# Patient Record
Sex: Female | Born: 1975
Health system: Southern US, Community
[De-identification: ages and names within clinical notes are randomized; demographics above are authoritative.]

## PROBLEM LIST (undated history)

## (undated) DIAGNOSIS — C801 Malignant (primary) neoplasm, unspecified: Secondary | ICD-10-CM

## (undated) DIAGNOSIS — N63 Unspecified lump in unspecified breast: Secondary | ICD-10-CM

## (undated) DIAGNOSIS — S060X9A Concussion with loss of consciousness of unspecified duration, initial encounter: Secondary | ICD-10-CM

## (undated) DIAGNOSIS — S060XAA Concussion with loss of consciousness status unknown, initial encounter: Secondary | ICD-10-CM

## (undated) DIAGNOSIS — J45909 Unspecified asthma, uncomplicated: Secondary | ICD-10-CM

## (undated) DIAGNOSIS — T7840XA Allergy, unspecified, initial encounter: Secondary | ICD-10-CM

## (undated) DIAGNOSIS — N3010 Interstitial cystitis (chronic) without hematuria: Secondary | ICD-10-CM

## (undated) DIAGNOSIS — D649 Anemia, unspecified: Secondary | ICD-10-CM

## (undated) DIAGNOSIS — E039 Hypothyroidism, unspecified: Secondary | ICD-10-CM

## (undated) HISTORY — DX: Allergy, unspecified, initial encounter: T78.40XA

## (undated) HISTORY — PX: APPENDECTOMY: SHX54

## (undated) HISTORY — DX: Concussion with loss of consciousness status unknown, initial encounter: S06.0XAA

## (undated) HISTORY — DX: Unspecified asthma, uncomplicated: J45.909

## (undated) HISTORY — PX: ABDOMINOPLASTY: SHX5355

## (undated) HISTORY — PX: ABDOMINAL HYSTERECTOMY: SHX81

## (undated) HISTORY — PX: CHOLECYSTECTOMY: SHX55

## (undated) HISTORY — DX: Concussion with loss of consciousness of unspecified duration, initial encounter: S06.0X9A

## (undated) HISTORY — DX: Anemia, unspecified: D64.9

## (undated) HISTORY — PX: TUBAL LIGATION: SHX77

---
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
## 2012-03-29 ENCOUNTER — Encounter (HOSPITAL_COMMUNITY): Payer: Self-pay | Admitting: Emergency Medicine

## 2012-03-29 ENCOUNTER — Emergency Department (HOSPITAL_COMMUNITY)
Admission: EM | Admit: 2012-03-29 | Discharge: 2012-03-29 | Disposition: A | Discharge status: Home / Self Care | Payer: Worker's Compensation | Attending: Emergency Medicine | Admitting: Emergency Medicine

## 2012-03-29 DIAGNOSIS — Z7721 Contact with and (suspected) exposure to potentially hazardous body fluids: Secondary | ICD-10-CM | POA: Insufficient documentation

## 2012-03-29 DIAGNOSIS — N3010 Interstitial cystitis (chronic) without hematuria: Secondary | ICD-10-CM | POA: Insufficient documentation

## 2012-03-29 DIAGNOSIS — Z578 Occupational exposure to other risk factors: Secondary | ICD-10-CM

## 2012-03-29 DIAGNOSIS — Z79899 Other long term (current) drug therapy: Secondary | ICD-10-CM | POA: Insufficient documentation

## 2012-03-29 DIAGNOSIS — E039 Hypothyroidism, unspecified: Secondary | ICD-10-CM | POA: Insufficient documentation

## 2012-03-29 DIAGNOSIS — Z8742 Personal history of other diseases of the female genital tract: Secondary | ICD-10-CM | POA: Insufficient documentation

## 2012-03-29 HISTORY — DX: Interstitial cystitis (chronic) without hematuria: N30.10

## 2012-03-29 HISTORY — DX: Hypothyroidism, unspecified: E03.9

## 2012-03-29 LAB — HEPATITIS C ANTIBODY (REFLEX): HCV Ab: NEGATIVE

## 2012-03-29 LAB — HEPATITIS B SURFACE ANTIGEN: Hepatitis B Surface Ag: NEGATIVE

## 2012-03-29 LAB — HIV RAPID SCREEN (BLD OR BODY FLD EXPOSURE): Rapid HIV Screen: NONREACTIVE

## 2012-03-29 NOTE — ED Notes (Signed)
Patient works at [facility] [employer] and was exposed to blood with a patient during a needle stick.  Patient sent here for blood work.

## 2012-03-29 NOTE — ED Notes (Signed)
Patient left before signing the signature pad for d/c home

## 2012-03-29 NOTE — ED Notes (Signed)
Patient reports that she was accidentally stuck with needle on right pointer finger while starting intravenous line on patient. Patient has no known history of blood diseases.

## 2012-03-29 NOTE — ED Provider Notes (Signed)
Medical screening examination/treatment/procedure(s) were performed by non-physician practitioner and as supervising physician I was immediately available for consultation/collaboration.   Laray Anger, DO 03/29/12 1913

## 2012-03-29 NOTE — ED Provider Notes (Signed)
History     CSN: [id number]  Arrival date & time 03/29/12  0009   First MD Initiated Contact with Patient 03/29/12 0036      Chief Complaint  Patient presents with  . Body Fluid Exposure    HPI  History provided by the patient. Patient is a 37 year old female who presents after body fluid exposure. 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 Patient states that she reviewed her patient's medical records and did not find any concerning evidence of a possible transmissible disease. Patient was married in a monogamous relationship hent otherwise low risk. Patient immediately washed her finger and rubbed alcohol wipes many times over the area. She was instructed to come to emergency room for further evaluation and treatment. She has been vaccinated against hepatitis B and is current on her tetanus immunization. She currently denies any pain or discomfort. No other aggravating or alleviating factors. No other associated symptoms    Past Medical History  Diagnosis Date  . Hypothyroid   . Interstitial cystitis     Past Surgical History  Procedure Date  . Tubal ligation     History reviewed. No pertinent family history.  History  Substance Use Topics  . Smoking status: Never Smoker   . Smokeless tobacco: Not on file  . Alcohol Use: No    OB History    Grav Para Term Preterm Abortions TAB SAB Ect Mult Living                  Review of Systems  All other systems reviewed and are negative.    Allergies  Shellfish allergy  Home Medications   Current Outpatient Rx  Name  Route  Sig  Dispense  Refill  . ACETAMINOPHEN 500 MG PO TABS   Oral   Take 500 mg by mouth every 6 (six) hours as needed. For pain         . ALBUTEROL SULFATE HFA 108 (90 BASE) MCG/ACT IN AERS   Inhalation   Inhale 2  puffs into the lungs every 6 (six) hours as needed. For shortness of breath         . AMOXICILLIN 500 MG PO CAPS   Oral   Take 500 mg by mouth 2 (two) times daily.         Marland Kitchen VITAMIN D 1000 UNITS PO TABS   Oral   Take 2,000 Units by mouth daily.         Marland Kitchen FLAX SEEDS PO   Oral   Take 2 tablets by mouth daily.         Marland Kitchen FLUTICASONE PROPIONATE 50 MCG/ACT NA SUSP   Nasal   Place 2 sprays into the nose daily.         . GUAIFENESIN ER 600 MG PO TB12   Oral   Take 1,200 mg by mouth daily as needed. For congestion         . MILK THISTLE 175 MG PO TABS   Oral   Take 175 mg by mouth daily.         Marland Kitchen PENTOSAN POLYSULFATE SODIUM 100 MG PO CAPS   Oral   Take 100 mg by mouth daily.         . THYROID 90 MG PO TABS   Oral  Take 180 mg by mouth daily.           BP 126/76  Pulse 84  Temp 97.5 F (36.4 C) (Oral)  Resp 16  SpO2 100%  Physical Exam  Nursing note and vitals reviewed. Constitutional: She is oriented to person, place, and time. She appears well-developed and well-nourished. No distress.  HENT:  Head: Normocephalic.  Eyes: Conjunctivae normal are normal.  Cardiovascular: Normal rate and regular rhythm.   Pulmonary/Chest: Effort normal and breath sounds normal.  Abdominal: Soft.  Musculoskeletal: Normal range of motion.       Evidence of a small puncture wound to the right index finger consistent with history of needle stick. No active bleeding. No swelling. Normal range of motion.  Neurological: She is alert and oriented to person, place, and time.  Skin: Skin is warm and dry. No rash noted.  Psychiatric: She has a normal mood and affect. Her behavior is normal.    ED Course  Procedures    Labs Reviewed  HIV RAPID SCREEN (BLD OR BODY FLD EXPOSURE)  HEPATITIS B SURFACE ANTIGEN  HEPATITIS C ANTIBODY (REFLEX)    1. Employee exposure to body fluids       MDM  Patient seen and evaluated. Patient in no acute distress. Exposure panel sent.  Patient instructed to followup with occupational health department and PCP.        Angus Seller, [place] 03/29/12 [phone]

## 2016-07-10 ENCOUNTER — Encounter (HOSPITAL_COMMUNITY): Payer: Self-pay | Admitting: Emergency Medicine

## 2016-07-10 DIAGNOSIS — Y939 Activity, unspecified: Secondary | ICD-10-CM | POA: Insufficient documentation

## 2016-07-10 DIAGNOSIS — Y999 Unspecified external cause status: Secondary | ICD-10-CM | POA: Insufficient documentation

## 2016-07-10 DIAGNOSIS — S3992XA Unspecified injury of lower back, initial encounter: Secondary | ICD-10-CM | POA: Diagnosis present

## 2016-07-10 DIAGNOSIS — Z91040 Latex allergy status: Secondary | ICD-10-CM | POA: Diagnosis not present

## 2016-07-10 DIAGNOSIS — Y92410 Unspecified street and highway as the place of occurrence of the external cause: Secondary | ICD-10-CM | POA: Diagnosis not present

## 2016-07-10 DIAGNOSIS — M542 Cervicalgia: Secondary | ICD-10-CM | POA: Insufficient documentation

## 2016-07-10 DIAGNOSIS — S39012A Strain of muscle, fascia and tendon of lower back, initial encounter: Secondary | ICD-10-CM | POA: Diagnosis not present

## 2016-07-10 DIAGNOSIS — R930 Abnormal findings on diagnostic imaging of skull and head, not elsewhere classified: Secondary | ICD-10-CM | POA: Diagnosis not present
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
## 2016-07-11 ENCOUNTER — Emergency Department (HOSPITAL_COMMUNITY): Payer: Managed Care, Other (non HMO)

## 2016-07-11 ENCOUNTER — Emergency Department (HOSPITAL_COMMUNITY)
Admission: EM | Admit: 2016-07-11 | Discharge: 2016-07-11 | Disposition: A | Discharge status: Home / Self Care | Payer: Managed Care, Other (non HMO) | Attending: Emergency Medicine | Admitting: Emergency Medicine

## 2016-07-11 DIAGNOSIS — S39012A Strain of muscle, fascia and tendon of lower back, initial encounter: Secondary | ICD-10-CM | POA: Diagnosis not present

## 2016-07-11 MED ORDER — IBUPROFEN 800 MG PO TABS
800.0000 mg | ORAL_TABLET | Freq: Once | ORAL | Status: AC
  Administered 2016-07-11: 800 mg via ORAL
  Filled 2016-07-11: qty 1

## 2016-07-11 MED ORDER — CYCLOBENZAPRINE HCL 10 MG PO TABS: 10.0000 mg | ORAL_TABLET | Freq: Two times a day (BID) | ORAL | 0 refills | Status: DC | PRN

## 2016-07-11 MED ORDER — IBUPROFEN 800 MG PO TABS: 800.0000 mg | ORAL_TABLET | Freq: Three times a day (TID) | ORAL | 0 refills | Status: DC

## 2016-07-11 NOTE — ED Notes (Signed)
C-collar removed by Shirlean Mylar, EMT per Dr.Rancour

## 2016-07-11 NOTE — Discharge Instructions (Signed)
Your xrays are reassuring. Follow up with your doctor. Return to the ED if you develop new or worsening symptoms.

## 2016-07-11 NOTE — ED Notes (Signed)
Ambulated pt from room D32 in front of nursing desk to Trauma B and back to room. Pt denies any sob or dizziness. Dr Wyvonnia Dusky assessed pt upon arrival to room.
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
## 2016-07-11 NOTE — ED Notes (Signed)
Patient transported to X-ray 
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
## 2017-05-07 ENCOUNTER — Other Ambulatory Visit: Payer: Self-pay | Admitting: Radiology

## 2017-05-07 DIAGNOSIS — N6310 Unspecified lump in the right breast, unspecified quadrant: Secondary | ICD-10-CM

## 2017-05-16 ENCOUNTER — Ambulatory Visit
Admission: RE | Admit: 2017-05-16 | Discharge: 2017-05-16 | Disposition: A | Discharge status: Home / Self Care | Payer: BLUE CROSS/BLUE SHIELD | Source: Ambulatory Visit | Attending: Radiology | Admitting: Radiology

## 2017-05-16 DIAGNOSIS — N6310 Unspecified lump in the right breast, unspecified quadrant: Secondary | ICD-10-CM

## 2017-05-16 HISTORY — DX: Malignant (primary) neoplasm, unspecified: C80.1

## 2017-05-16 HISTORY — DX: Unspecified lump in unspecified breast: N63.0

## 2017-06-11 ENCOUNTER — Ambulatory Visit: Payer: Self-pay | Admitting: Obstetrics & Gynecology

## 2018-10-29 ENCOUNTER — Ambulatory Visit (INDEPENDENT_AMBULATORY_CARE_PROVIDER_SITE_OTHER): Payer: BC Managed Care – PPO | Admitting: Neurology

## 2018-10-29 ENCOUNTER — Other Ambulatory Visit: Payer: Self-pay

## 2018-10-29 ENCOUNTER — Encounter: Payer: Self-pay | Admitting: Neurology

## 2018-10-29 ENCOUNTER — Encounter: Payer: Self-pay | Admitting: *Deleted

## 2018-10-29 VITALS — BP 120/74 | HR 80 | Temp 97.8°F | Ht 64.0 in | Wt 163.0 lb

## 2018-10-29 DIAGNOSIS — R29898 Other symptoms and signs involving the musculoskeletal system: Secondary | ICD-10-CM | POA: Diagnosis not present

## 2018-10-29 DIAGNOSIS — M5416 Radiculopathy, lumbar region: Secondary | ICD-10-CM | POA: Diagnosis not present

## 2018-10-29 DIAGNOSIS — R32 Unspecified urinary incontinence: Secondary | ICD-10-CM | POA: Diagnosis not present

## 2018-10-29 DIAGNOSIS — R202 Paresthesia of skin: Secondary | ICD-10-CM

## 2018-10-29 DIAGNOSIS — R200 Anesthesia of skin: Secondary | ICD-10-CM
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
## 2018-10-29 NOTE — Patient Instructions (Signed)
Physical Therapy MRI lumbar Spine Emg/ncs   Electromyoneurogram Electromyoneurogram is a test to check how well your muscles and nerves are working. This procedure includes the combined use of electromyogram (EMG) and nerve conduction study (NCS). EMG is used to look for muscular disorders. NCS, which is also called electroneurogram, measures how well your nerves are controlling your muscles. The procedures are usually done together to check if your muscles and nerves are healthy. If the results of the tests are abnormal, this may indicate disease or injury, such as a neuromuscular disease or peripheral nerve damage. Tell a health care provider about:  Any allergies you have.  All medicines you are taking, including vitamins, herbs, eye drops, creams, and over-the-counter medicines.  Any problems you or family members have had with anesthetic medicines.  Any blood disorders you have.  Any surgeries you have had.  Any medical conditions you have.  If you have a pacemaker.  Whether you are pregnant or may be pregnant. What are the risks? Generally, this is a safe procedure. However, problems may occur, including:  Infection where the electrodes were inserted.  Bleeding. What happens before the procedure? Medicines Ask your health care provider about:  Changing or stopping your regular medicines. This is especially important if you are taking diabetes medicines or blood thinners.  Taking medicines such as aspirin and ibuprofen. These medicines can thin your blood. Do not take these medicines unless your health care provider tells you to take them.  Taking over-the-counter medicines, vitamins, herbs, and supplements. General instructions  Your health care provider may ask you to avoid: ? Beverages that have caffeine, such as coffee and tea. ? Any products that contain nicotine or tobacco. These products include cigarettes, e-cigarettes, and chewing tobacco. If you need help  quitting, ask your health care provider.  Do not use lotions or creams on the same day that you will be having the procedure. What happens during the procedure? For EMG   Your health care provider will ask you to stay in a position so that he or she can access the muscle that will be studied. You may be standing, sitting, or lying down.  You may be given a medicine that numbs the area (local anesthetic).  A very thin needle that has an electrode will be inserted into your muscle.  Another small electrode will be placed on your skin near the muscle.  Your health care provider will ask you to continue to remain still.  The electrodes will send a signal that tells about the electrical activity of your muscles. You may see this on a monitor or hear it in the room.  After your muscles have been studied at rest, your health care provider will ask you to contract or flex your muscles. The electrodes will send a signal that tells about the electrical activity of your muscles.  Your health care provider will remove the electrodes and the electrode needles when the procedure is finished. The procedure may vary among health care providers and hospitals. For NCS   An electrode that records your nerve activity (recording electrode) will be placed on your skin by the muscle that is being studied.  An electrode that is used as a reference (reference electrode) will be placed near the recording electrode.  A paste or gel will be applied to your skin between the recording electrode and the reference electrode.  Your nerve will be stimulated with a mild shock. Your health care provider will measure how much  time it takes for your muscle to react.  Your health care provider will remove the electrodes and the gel when the procedure is finished. The procedure may vary among health care providers and hospitals. What happens after the procedure?  It is up to you to get the results of your procedure.  Ask your health care provider, or the department that is doing the procedure, when your results will be ready.  Your health care provider may: ? Give you medicines for any pain. ? Monitor the insertion sites to make sure that bleeding stops. Summary  Electromyoneurogram is a test to check how well your muscles and nerves are working.  If the results of the tests are abnormal, this may indicate disease or injury.  This is a safe procedure. However, problems may occur, such as bleeding and infection.  Your health care provider will do two tests to complete this procedure. One checks your muscles (EMG) and another checks your nerves (NCS).  It is up to you to get the results of your procedure. Ask your health care provider, or the department that is doing the procedure, when your results will be ready. This information is not intended to replace advice given to you by your health care provider. Make sure you discuss any questions you have with your health care provider. Document Released: 06/14/2004 Document Revised: 10/28/2017 Document Reviewed: 10/10/2017 Elsevier Patient Education  2020 [employer].

## 2018-11-04 ENCOUNTER — Telehealth: Payer: Self-pay | Admitting: Neurology

## 2018-11-04 LAB — COMPREHENSIVE METABOLIC PANEL
ALT: 65 IU/L — ABNORMAL HIGH (ref 0–32)
AST: 37 IU/L (ref 0–40)
Albumin/Globulin Ratio: 2.5 — ABNORMAL HIGH (ref 1.2–2.2)
Albumin: 4.5 g/dL (ref 3.8–4.8)
Alkaline Phosphatase: 53 IU/L (ref 39–117)
BUN/Creatinine Ratio: 13 (ref 9–23)
BUN: 9 mg/dL (ref 6–24)
Bilirubin Total: 0.9 mg/dL (ref 0.0–1.2)
CO2: 23 mmol/L (ref 20–29)
Calcium: 9.1 mg/dL (ref 8.7–10.2)
Chloride: 104 mmol/L (ref 96–106)
Creatinine, Ser: 0.67 mg/dL (ref 0.57–1.00)
GFR calc Af Amer: 125 mL/min/{1.73_m2} (ref >59)
GFR calc non Af Amer: 109 mL/min/{1.73_m2} (ref >59)
Globulin, Total: 1.8 g/dL (ref 1.5–4.5)
Glucose: 96 mg/dL (ref 65–99)
Potassium: 4.3 mmol/L (ref 3.5–5.2)
Sodium: 140 mmol/L (ref 134–144)
Total Protein: 6.3 g/dL (ref 6.0–8.5)

## 2018-11-04 LAB — METHYLMALONIC ACID, SERUM: Methylmalonic Acid: 118 nmol/L (ref 0–378)

## 2018-11-04 LAB — CBC
Hematocrit: 43 % (ref 34.0–46.6)
Hemoglobin: 14.9 g/dL (ref 11.1–15.9)
MCH: 30.5 pg (ref 26.6–33.0)
MCHC: 34.7 g/dL (ref 31.5–35.7)
MCV: 88 fL (ref 79–97)
Platelets: 237 10*3/uL (ref 150–450)
RBC: 4.89 x10E6/uL (ref 3.77–5.28)
RDW: 12.2 % (ref 11.7–15.4)
WBC: 4 10*3/uL (ref 3.4–10.8)

## 2018-11-04 LAB — B12 AND FOLATE PANEL
Folate: 4.1 ng/mL (ref >3.0)
Vitamin B-12: 978 pg/mL (ref 232–1245)

## 2018-11-04 NOTE — Telephone Encounter (Signed)
unable to leave vmail the mail box was full  BCBS Auth: [id number] (exp. 11/04/18 to 05/02/19)

## 2018-11-05 ENCOUNTER — Telehealth: Payer: Self-pay | Admitting: *Deleted

## 2018-11-05 NOTE — Telephone Encounter (Signed)
I spoke with the patient and discussed her lab results. She verbalized understanding of unremarkable labs however ALT was elevated slightly. Recommended f/u with PCP in the next few months. I recommended she setup a mychart as the pt asked if the results could be posted. Confirmed cell phone and sent mychart info to her phone. I asked her to let us know when the account is setup so we can release the labs. The pt verbalized understanding and appreciation for the call.

## 2018-11-05 NOTE — Telephone Encounter (Signed)
-----   Message from Melvenia Beam, MD sent at 11/02/2018  8:36 PM EDT ----- Labs unremarkable, slight increase in ALT may be due to many things such as fatty liver, medications (too much tylenol for example), infections, alcohol or other. However it is minimally elevated,not concerning at this point, I recommend following up in primary care in a few months not urgent.

## 2018-11-10 ENCOUNTER — Ambulatory Visit: Payer: BC Managed Care – PPO | Attending: Neurology

## 2018-11-10 ENCOUNTER — Other Ambulatory Visit: Payer: Self-pay

## 2018-11-10 DIAGNOSIS — M62830 Muscle spasm of back: Secondary | ICD-10-CM | POA: Diagnosis present

## 2018-11-10 DIAGNOSIS — G8929 Other chronic pain: Secondary | ICD-10-CM

## 2018-11-10 DIAGNOSIS — M5442 Lumbago with sciatica, left side: Secondary | ICD-10-CM | POA: Diagnosis present

## 2018-11-10 DIAGNOSIS — M5441 Lumbago with sciatica, right side: Secondary | ICD-10-CM | POA: Diagnosis present

## 2018-11-10 DIAGNOSIS — R293 Abnormal posture: Secondary | ICD-10-CM | POA: Diagnosis present

## 2018-11-10 NOTE — Patient Instructions (Signed)
LTR , knee to chest , piriformis 2-3 reps 30 sec x 2 per dau , PPT 10 reps  5-10 sec

## 2018-11-10 NOTE — Telephone Encounter (Signed)
x2 unable to leave vmail the mail box is full.
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
## 2018-11-20 ENCOUNTER — Encounter

## 2018-11-25 ENCOUNTER — Ambulatory Visit: Payer: BC Managed Care – PPO | Admitting: Physical Therapy

## 2018-12-03 ENCOUNTER — Ambulatory Visit (INDEPENDENT_AMBULATORY_CARE_PROVIDER_SITE_OTHER): Payer: BC Managed Care – PPO | Admitting: Neurology

## 2018-12-03 ENCOUNTER — Ambulatory Visit: Payer: BC Managed Care – PPO | Admitting: Neurology

## 2018-12-03 ENCOUNTER — Other Ambulatory Visit: Payer: Self-pay

## 2018-12-03 DIAGNOSIS — Z0289 Encounter for other administrative examinations: Secondary | ICD-10-CM

## 2018-12-03 DIAGNOSIS — R202 Paresthesia of skin: Secondary | ICD-10-CM | POA: Diagnosis not present

## 2018-12-03 NOTE — Progress Notes (Signed)
Medial lower leg sensory changes. See procedure note

## 2018-12-03 NOTE — Telephone Encounter (Signed)
I spoke to the patient she is scheduled at [facility] for 12/08/18 no to the covid questions.

## 2018-12-04 ENCOUNTER — Ambulatory Visit: Payer: BLUE CROSS/BLUE SHIELD | Admitting: Physical Therapy
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
## 2018-12-08 ENCOUNTER — Ambulatory Visit: Payer: BC Managed Care – PPO

## 2018-12-08 ENCOUNTER — Other Ambulatory Visit: Payer: Self-pay

## 2018-12-08 DIAGNOSIS — M5416 Radiculopathy, lumbar region: Secondary | ICD-10-CM

## 2018-12-08 DIAGNOSIS — R32 Unspecified urinary incontinence: Secondary | ICD-10-CM | POA: Diagnosis not present

## 2018-12-08 DIAGNOSIS — R202 Paresthesia of skin: Secondary | ICD-10-CM

## 2018-12-08 DIAGNOSIS — R29898 Other symptoms and signs involving the musculoskeletal system: Secondary | ICD-10-CM

## 2018-12-08 DIAGNOSIS — R200 Anesthesia of skin: Secondary | ICD-10-CM

## 2018-12-13 ENCOUNTER — Other Ambulatory Visit: Payer: Self-pay | Admitting: Neurology

## 2018-12-13 MED ORDER — GABAPENTIN 300 MG PO CAPS: 300.0000 mg | ORAL_CAPSULE | Freq: Three times a day (TID) | ORAL | 4 refills | Status: DC

## 2018-12-14 ENCOUNTER — Other Ambulatory Visit: Payer: Self-pay | Admitting: Neurology

## 2018-12-14 DIAGNOSIS — G5780 Other specified mononeuropathies of unspecified lower limb: Secondary | ICD-10-CM

## 2018-12-14 DIAGNOSIS — S8990XD Unspecified injury of unspecified lower leg, subsequent encounter: Secondary | ICD-10-CM

## 2018-12-16 ENCOUNTER — Ambulatory Visit (INDEPENDENT_AMBULATORY_CARE_PROVIDER_SITE_OTHER): Payer: Self-pay | Admitting: Rehabilitative and Restorative Service Providers"

## 2018-12-16 ENCOUNTER — Encounter: Payer: Self-pay | Admitting: Rehabilitative and Restorative Service Providers"

## 2018-12-16 ENCOUNTER — Other Ambulatory Visit: Payer: Self-pay

## 2018-12-16 DIAGNOSIS — M5442 Lumbago with sciatica, left side: Secondary | ICD-10-CM | POA: Diagnosis not present

## 2018-12-16 DIAGNOSIS — M5441 Lumbago with sciatica, right side: Secondary | ICD-10-CM

## 2018-12-16 DIAGNOSIS — G8929 Other chronic pain: Secondary | ICD-10-CM

## 2018-12-16 NOTE — Patient Instructions (Signed)
Access Code: [place]  URL: [email or web address]  Date: 12/16/2018  Prepared by: Rudell Cobb   Exercises Hip Flexor Stretch at [facility] of Bed - 2-3 reps - 1 sets - 30 seconds hold - 2x daily - 7x weekly Sidelying ITB Stretch off Table - 2-3 reps - 1 sets - 30 seconds hold - 2x daily - 7x weekly Supine Hamstring Stretch with Strap - 2-3 reps - 1 sets - 30 seconds hold - 2x daily - 7x weekly Standing Ankle Dorsiflexor Toe Extensor Stretch - 10 reps - 3 sets                            - 1x daily - 7x weekly

## 2018-12-16 NOTE — Therapy (Signed)
[place], [place], [address] Phone: [phone]   Fax:  [phone]  Physical Therapy Treatment  Patient Details  Name: Jamie Cardenas MRN: [id number] Date of Birth: [birth year] Referring Provider (PT): Sarina Ill, MD   Encounter Date: 12/16/2018  PT End of Session - 12/16/18 0813    Visit Number  2    Number of Visits  12    Date for PT Re-Evaluation  12/25/18    Authorization Type  BCBS    PT Start Time  0725    PT Stop Time  0810    PT Time Calculation (min)  45 min       Past Medical History:  Diagnosis Date  . Breast mass    Right breast lumps   . Cancer ([place]) 1997   cervical   . Concussion    x2  . Hypothyroid   . Interstitial cystitis     Past Surgical History:  Procedure Laterality Date  . ABDOMINAL HYSTERECTOMY    . APPENDECTOMY    . CHOLECYSTECTOMY    . TUBAL LIGATION      There were no vitals filed for this visit.  Subjective Assessment - 12/16/18 0727    Subjective  The patient reports swelling every day in her distal legs (left leg greater than right).  She has pain in bilateral LEs with standing and walking and describes an achiness.  She also cannot sit and ride for a long time and feels radiating leg pain.    Patient Stated Goals  She wants to be able to work out again    Currently in Pain?  Yes    Pain Location  Hip    Pain Orientation  Right;Left    Pain Descriptors / Indicators  Aching;Sore    Pain Onset  More than a month ago    Pain Frequency  Constant    Aggravating Factors   sitting for long periods; walking, standing.    Pain Relieving Factors  stretching         [facility] PT Assessment - 12/16/18 0731      AROM   AROM Assessment Site  Knee;Ankle    Right/Left Knee  --   WFLs   Right/Left Ankle  Left   c/o tightness L anterior tib t/o the day     Strength   Overall Strength Comments  3+/5 L hip abduction 4/5 L hip extension 5/5 R hip  extension                   [facility] Adult PT Treatment/Exercise - 12/16/18 0816      Exercises   Exercises  Knee/Hip;Ankle      Knee/Hip Exercises: Stretches   Active Hamstring Stretch  Right;Left;2 reps;30 seconds    Active Hamstring Stretch Limitations  supine with strap, noted tightness L leg in comparisong to right leg    Quad Stretch  Left;Right;2 reps;20 seconds   prone   Hip Flexor Stretch  Right;Left;1 rep;30 seconds    Hip Flexor Stretch Limitations  Patient c/o L SI joint pain *performed L SI mobs after stretch to increase mobility.  Recommended patient attempt at home and hold if continues to provoke L SI pain    ITB Stretch  Right;Left;30 seconds;1 rep    ITB Stretch Limitations  Taught for HEP      Manual Therapy   Manual Therapy  Joint mobilization;Neural Stretch;Manual Traction;Soft tissue mobilization    Manual therapy comments  PT provided joint mobilization, hip traction for pain reduction and to increase mobility    Joint Mobilization  PRONE:  L hip ER and abducted (knee on stool in flexion) with grade II-III joint mobility at posterior femur provided for hip tightness.  L SI posterior>anterior joint mobilization grade II with some discomfort when PT has patinet IR L hip during mobilizations.  SUPINE:  anterior > posterior hip joint mobility grade III    Soft tissue mobilization  SIDELYING:  IT band STM    Manual Traction  Hip distraction using belt x 5 minutes each leg for general joint mobility.  L hip has some discomfort noted.      Ankle Exercises: Stretches   Other Stretch  Standing anterior tibialis stretch x 30 seconds x 3 reps.  *Patient notes discomfort-- instructed to go to point of pull, not pain.             PT Education - 12/16/18 0812    Education Details  HEP per [employer]) Educated  Patient    Methods  Explanation;Demonstration;Handout    Comprehension  Verbalized understanding;Returned demonstration       PT Short  Term Goals - 12/16/18 0814      PT SHORT TERM GOAL #1   Title  She will be indpendent with iniitial HEp    Baseline  PT established initial HEP for stretching hip flexors, anterior tibialis, hamstrings, and IT band.    Time  2    Period  Weeks    Status  Achieved    Target Date  11/24/18      PT SHORT TERM GOAL #2   Title  She will report pain decr 20% or more in lower back    Time  6    Period  Weeks    Status  On-going    Target Date  11/24/18        PT Long Term Goals - 12/16/18 0815      PT LONG TERM GOAL #1   Title  she will be indpendent with all HEP issued    Time  6    Period  Weeks    Status  New    Target Date  12/22/18      PT LONG TERM GOAL #2   Title  she will report pain decreased 50% and become intermittant in back and legs    Time  6    Period  Weeks    Status  New      PT LONG TERM GOAL #3   Title  She will report sleep improved 50% or more due to decr pain    Time  6    Period  Weeks    Status  New      PT LONG TERM GOAL #4   Title  She reports  able to sit for 45 min without increased pain.    Time  6    Period  Weeks    Status  New            Plan - 12/16/18 0815    Clinical Impression Statement  The patient transferred care from our [facility] ortho clinic @ [address] to our [facility].  She works in [employer] and this location is more convenient.  At today's visit, she presents with tightness L hip joint, L SI joint pain, bilateral medial LE numbness, tightness/ pain with palpation bilateral IT bands, tightness L anterior tibialis, weakness L hip.  PT Frequency  2x / week    PT Duration  6 weeks    PT Treatment/Interventions  Dry needling;Passive range of motion;Manual techniques;Patient/family education;Therapeutic exercise;Electrical Stimulation;Moist Heat    PT Next Visit Plan  Dry needling (IT band, possibly anterior tibialist, L gluteal region?), Review home stretches, begin to add strengthening HEP trying plank, hip strengthening,  functional squats (assess tolerance).  Joint mobilization L SI, L hip.  Further assess knees as indicated.    Consulted and Agree with Plan of Care  Patient       Patient will benefit from skilled therapeutic intervention in order to improve the following deficits and impairments:  Pain, Increased muscle spasms, Decreased range of motion, Decreased activity tolerance  Visit Diagnosis: Chronic bilateral low back pain with bilateral sciatica     Problem List Patient Active Problem List   Diagnosis Date Noted  . Hypothyroid   . Interstitial cystitis     Valiant Dills,[place], PT 12/16/2018, 8:30 AM  [facility] [place], [place], [address] Phone: [phone]   Fax:  [phone]  Name: Henleigh Timperley MRN: [id number] Date of Birth: [birth year]

## 2018-12-22 ENCOUNTER — Encounter: Payer: BC Managed Care – PPO | Admitting: Rehabilitative and Restorative Service Providers"

## 2018-12-25 ENCOUNTER — Other Ambulatory Visit: Payer: Self-pay

## 2018-12-25 ENCOUNTER — Ambulatory Visit (INDEPENDENT_AMBULATORY_CARE_PROVIDER_SITE_OTHER): Payer: BC Managed Care – PPO | Admitting: Physical Therapy

## 2018-12-25 ENCOUNTER — Encounter: Payer: Self-pay | Admitting: Physical Therapy

## 2018-12-25 DIAGNOSIS — M5442 Lumbago with sciatica, left side: Secondary | ICD-10-CM | POA: Diagnosis not present

## 2018-12-25 DIAGNOSIS — R293 Abnormal posture: Secondary | ICD-10-CM | POA: Diagnosis not present

## 2018-12-25 DIAGNOSIS — G8929 Other chronic pain: Secondary | ICD-10-CM

## 2018-12-25 DIAGNOSIS — M5441 Lumbago with sciatica, right side: Secondary | ICD-10-CM | POA: Diagnosis not present

## 2018-12-25 DIAGNOSIS — M62830 Muscle spasm of back: Secondary | ICD-10-CM

## 2018-12-25 NOTE — Therapy (Signed)
[place], [place], [address] Phone: [phone]   Fax:  [phone]  Physical Therapy Treatment  Patient Details  Name: Jamie Cardenas MRN: [id number] Date of Birth: Feb 17, 1976 Referring Provider (PT): Sarina Ill, MD   Encounter Date: 12/25/2018  PT End of Session - 12/25/18 0804    Visit Number  3    Number of Visits  --    Date for PT Re-Evaluation  01/20/19    Authorization Type  BCBS    PT Start Time  0804    PT Stop Time  0846    PT Time Calculation (min)  42 min    Activity Tolerance  Patient tolerated treatment well    Behavior During Therapy  [facility] for tasks assessed/performed       Past Medical History:  Diagnosis Date  . Breast mass    Right breast lumps   . Cancer ([place]) 1997   cervical   . Concussion    x2  . Hypothyroid   . Interstitial cystitis     Past Surgical History:  Procedure Laterality Date  . ABDOMINAL HYSTERECTOMY    . APPENDECTOMY    . CHOLECYSTECTOMY    . TUBAL LIGATION      There were no vitals filed for this visit.  Subjective Assessment - 12/25/18 0805    Subjective  Still experiencing some swelliing in left knee at end of day.    Patient Stated Goals  She wants to be able to work out again    Currently in Pain?  Yes    Pain Score  4     Pain Location  Knee    Pain Orientation  Left    Pain Descriptors / Indicators  Aching;Sore                       [facility] Adult PT Treatment/Exercise - 12/25/18 0001      Knee/Hip Exercises: Stretches   Active Hamstring Stretch  Both;60 seconds;1 rep    Active Hamstring Stretch Limitations  supine with strap    Hip Flexor Stretch  Right;Left;60 seconds;2 reps    ITB Stretch  Both;1 rep;60 seconds    ITB Stretch Limitations  with strap across body    Other Knee/Hip Stretches  ant tib stretch in standing 2x 30 sec Lt      Knee/Hip Exercises: Aerobic   Nustep  L5 x 5 min just legs      Manual  Therapy   Manual Therapy  Soft tissue mobilization;Myofascial release    Manual therapy comments  skilled palpation and monitoring of soft tissue during DN      Soft tissue mobilization  to bil gluteals, quads     Myofascial Release  to left ITB       Trigger Point Dry Needling - 12/25/18 0001    Consent Given?  Yes    Education Handout Provided  Yes    Muscles Treated Lower Quadrant  Vastus lateralis;Rectus femoris;Anterior tibialis    Muscles Treated Back/Hip  Gluteus minimus;Gluteus medius    Dry Needling Comments  bil on quads/glut med    Rectus femoris Response  Twitch response elicited;Palpable increased muscle length    Vastus lateralis Response  Twitch response elicited;Palpable increased muscle length    Anterior tibialis Response  Palpable increased muscle length    Gluteus Minimus Response  Twitch response elicited;Palpable increased muscle length    Gluteus Medius Response  Twitch response  elicited;Palpable increased muscle length           PT Education - 12/25/18 0849    Education Details  DN education and aftercare    Person(s) Educated  Patient    Methods  Explanation;Demonstration;Handout    Comprehension  Verbalized understanding;Returned demonstration       PT Short Term Goals - 12/16/18 0814      PT SHORT TERM GOAL #1   Title  She will be indpendent with iniitial HEp    Baseline  PT established initial HEP for stretching hip flexors, anterior tibialis, hamstrings, and IT band.    Time  2    Period  Weeks    Status  Achieved    Target Date  11/24/18      PT SHORT TERM GOAL #2   Title  She will report pain decr 20% or more in lower back    Time  6    Period  Weeks    Status  On-going    Target Date  11/24/18        PT Long Term Goals - 12/16/18 0815      PT LONG TERM GOAL #1   Title  she will be indpendent with all HEP issued    Time  6    Period  Weeks    Status  New    Target Date  12/22/18      PT LONG TERM GOAL #2   Title  she will  report pain decreased 50% and become intermittant in back and legs    Time  6    Period  Weeks    Status  New      PT LONG TERM GOAL #3   Title  She will report sleep improved 50% or more due to decr pain    Time  6    Period  Weeks    Status  New      PT LONG TERM GOAL #4   Title  She reports  able to sit for 45 min without increased pain.    Time  6    Period  Weeks    Status  New            Plan - 12/25/18 [id number]    Clinical Impression Statement  Patient compliant with HEP. Very good response to DN in bil quads and left gluteals. May benefit from further DN and manual therapy in these regions.    PT Frequency  2x / week    PT Duration  6 weeks    PT Treatment/Interventions  Dry needling;Passive range of motion;Manual techniques;Patient/family education;Therapeutic exercise;Electrical Stimulation;Moist Heat    PT Next Visit Plan  begin to add strengthening HEP trying plank, hip strengthening, functional squats (assess tolerance).  Joint mobilization L SI, L hip.  DN prn possibly with estim. Further assess knees as indicated.    PT Home Exercise Plan  [place]    Consulted and Agree with Plan of Care  Patient       Patient will benefit from skilled therapeutic intervention in order to improve the following deficits and impairments:  Pain, Increased muscle spasms, Decreased range of motion, Decreased activity tolerance  Visit Diagnosis: Chronic bilateral low back pain with bilateral sciatica  Abnormal posture  Muscle spasm of back     Problem List Patient Active Problem List   Diagnosis Date Noted  . Hypothyroid   . Interstitial cystitis     Almyra Free Jarrel Knoke PT 12/25/2018, 1:15 PM  [place], [place], [address] Phone: [phone]   Fax:  [phone]  Name: Jimisha Askeland MRN: [id number] Date of Birth: Dec 08, 1975

## 2018-12-25 NOTE — Patient Instructions (Signed)
Access Code: [place]  URL: [email or web address]  Date: 12/25/2018  Prepared by: Madelyn Flavors   Exercises Hip Flexor Stretch at [facility] of Bed - 2-3 reps - 1 sets - 30 seconds hold - 2x daily - 7x weekly Sidelying ITB Stretch off Table - 2-3 reps - 1 sets - 30 seconds hold - 2x daily - 7x weekly Supine Hamstring Stretch with Strap - 2-3 reps - 1 sets - 30 seconds hold - 2x daily - 7x weekly Standing Ankle Dorsiflexor Toe Extensor Stretch - 10 reps - 3 sets                            - 1x daily - 7x weekly Patient Education Trigger Point Dry Needling

## 2018-12-25 NOTE — Addendum Note (Signed)
Addended by: Rudell Cobb M on: 12/25/2018 01:37 PM   Modules accepted: Orders

## 2018-12-30 ENCOUNTER — Other Ambulatory Visit: Payer: Self-pay

## 2018-12-30 ENCOUNTER — Ambulatory Visit (INDEPENDENT_AMBULATORY_CARE_PROVIDER_SITE_OTHER): Payer: BLUE CROSS/BLUE SHIELD | Admitting: Rehabilitative and Restorative Service Providers"

## 2018-12-30 DIAGNOSIS — M5441 Lumbago with sciatica, right side: Secondary | ICD-10-CM

## 2018-12-30 DIAGNOSIS — G8929 Other chronic pain: Secondary | ICD-10-CM | POA: Diagnosis not present

## 2018-12-30 DIAGNOSIS — M5442 Lumbago with sciatica, left side: Secondary | ICD-10-CM | POA: Diagnosis not present

## 2018-12-30 DIAGNOSIS — R293 Abnormal posture: Secondary | ICD-10-CM

## 2018-12-30 NOTE — Patient Instructions (Signed)
Access Code: [place]  URL: [email or web address]  Date: 12/30/2018  Prepared by: Rudell Cobb   Exercises Hip Flexor Stretch at [facility] of Bed - 2-3 reps - 1 sets - 30 seconds hold - 2x daily - 7x weekly Sidelying ITB Stretch off Table - 2-3 reps - 1 sets - 30 seconds hold - 2x daily - 7x weekly Supine Hamstring Stretch with Strap - 2-3 reps - 1 sets - 30 seconds hold - 2x daily - 7x weekly Standing Ankle Dorsiflexor Toe Extensor Stretch - 10 reps - 3 sets                            - 1x daily - 7x weekly Sidelying Hip Abduction - 12 reps - 2 sets - 2x daily - 7x weekly Prone Hip Extension - 12 reps - 2 sets - 2x daily - 7x weekly [place] - 12 reps - 2 sets - 2x daily - 7x weekly Patient Education Ice Massage

## 2018-12-30 NOTE — Therapy (Signed)
[name], [place], [address] Phone: [phone]   Fax:  [phone]  Physical Therapy Treatment  Patient Details  Name: Jamie Cardenas MRN: [id number] Date of Birth: 04-26-1975 Referring Provider (PT): Sarina Ill, MD   Encounter Date: 12/30/2018  PT End of Session - 12/30/18 0738    Visit Number  4    Date for PT Re-Evaluation  01/20/19    Authorization Type  BCBS    PT Start Time  0721    PT Stop Time  0800    PT Time Calculation (min)  39 min    Activity Tolerance  Patient tolerated treatment well    Behavior During Therapy  [facility] for tasks assessed/performed       Past Medical History:  Diagnosis Date  . Breast mass    Right breast lumps   . Cancer ([place]) 1997   cervical   . Concussion    x2  . Hypothyroid   . Interstitial cystitis     Past Surgical History:  Procedure Laterality Date  . ABDOMINAL HYSTERECTOMY    . APPENDECTOMY    . CHOLECYSTECTOMY    . TUBAL LIGATION      There were no vitals filed for this visit.  Subjective Assessment - 12/30/18 0724    Subjective  The patient reports she has done exercises twice since last session.  She is having medial knee pain on the L side and notes increased swelling on the L knee.    Patient Stated Goals  She wants to be able to work out again    Currently in Pain?  Yes    Pain Score  --   hard to know, "I'm always uncomfortable."   Pain Location  Knee    Pain Orientation  Left    Pain Descriptors / Indicators  Aching;Sore    Pain Onset  More than a month ago    Pain Frequency  Constant    Aggravating Factors   sitting for long periods, walking, standing    Pain Relieving Factors  stretching         [facility] PT Assessment - 12/30/18 1256      Special Tests    Special Tests  Laxity/Instability Tests    Laxity/Instability   Anterior drawer test;Posterior drawer test;other      Anterior drawer test   Findings  Negative     Side  Left      Posterior drawer test   Findings  Negative    Side   Left      Other   Findings  Positive    side  Left    comment  valgus stress test positive for medial knee pain                   [facility] Adult PT Treatment/Exercise - 12/30/18 1252      Self-Care   Self-Care  Other Self-Care Comments    Other Self-Care Comments   ice massage recommended over point tender area L medial knee      Exercises   Exercises  Knee/Hip;Ankle      Knee/Hip Exercises: Stretches   Other Knee/Hip Stretches  Hipe ER and IR stretch in long sitting>bending knees> performing rotation; butterfly stretch seated      Knee/Hip Exercises: Aerobic   Other Aerobic  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      Knee/Hip Exercises: Supine   Bridges  10 reps    Bridges with [employer]  Strengthening;5 reps   painful with fall due to point tender L knee (medial)   Other Supine Knee/Hip Exercises  bridges with marching x 10 reps      Knee/Hip Exercises: Sidelying   Hip ABduction  Strengthening;Left;10 reps      Knee/Hip Exercises: Prone   Hip Extension  Strengthening;Left;10 reps    Hip Extension Limitations  with cues for abdominal contraction to support low back      Manual Therapy   Manual Therapy  Joint mobilization    Joint Mobilization  Prone: L hip posterior>anterior grade II mobs.; supine LAD with clicking in L ankle             PT Education - 12/30/18 1250    Education Details  updated HEP    Person(s) Educated  Patient    Methods  Demonstration;Handout;Explanation    Comprehension  Verbalized understanding;Returned demonstration       PT Short Term Goals - 12/16/18 [id number]      PT SHORT TERM GOAL #1   Title  She will be indpendent with iniitial HEp    Baseline  PT established initial HEP for stretching hip flexors, anterior tibialis, hamstrings, and IT band.     Time  2    Period  Weeks    Status  Achieved    Target Date  11/24/18      PT SHORT TERM GOAL #2   Title  She will report pain decr 20% or more in lower back    Time  6    Period  Weeks    Status  On-going    Target Date  11/24/18        PT Long Term Goals - 12/30/18 1251      PT LONG TERM GOAL #1   Title  she will be indpendent with all HEP issued    Time  6    Period  Weeks    Status  On-going    Target Date  01/20/19      PT LONG TERM GOAL #2   Title  she will report pain decreased 50% and become intermittant in back and legs    Time  6    Period  Weeks    Status  On-going    Target Date  01/20/19      PT LONG TERM GOAL #3   Title  She will report sleep improved 50% or more due to decr pain    Time  6    Period  Weeks    Status  On-going    Target Date  01/20/19      PT LONG TERM GOAL #4   Title  She reports  able to sit for 45 min without increased pain.    Time  6    Period  Weeks    Status  On-going    Target Date  01/20/19            Plan - 12/30/18 1257    Clinical Impression Statement  The patient is progressing with stretching and PT added strengthening today to HEP.  Plan to continue emphasizing LE strengthening, progression of squats and lunges to tolerance and further focus on L medial knee pain.    PT Treatment/Interventions  Dry needling;Passive range of motion;Manual techniques;Patient/family education;Therapeutic exercise;Electrical Stimulation;Moist Heat;Iontophoresis 4mg /ml Dexamethasone;Ultrasound;ADLs/Self Care Home Management;Joint  Manipulations;Spinal Manipulations    PT Next Visit Plan  check HEP tolerance, ionto to L knee, joint mobilization L SI region, L hip, L knee self care (ice massage) and soft tissue mobilization    PT Home Exercise Plan  [place] and Agree with Plan of Care  Patient       Patient will benefit from skilled therapeutic intervention in order to improve the following deficits and impairments:   Pain, Increased muscle spasms, Decreased range of motion, Decreased activity tolerance  Visit Diagnosis: Chronic bilateral low back pain with bilateral sciatica  Abnormal posture     Problem List Patient Active Problem List   Diagnosis Date Noted  . Hypothyroid   . Interstitial cystitis     Tremond Shimabukuro,Meyer, PT 12/30/2018, 1:00 PM  [facility] [place], [place], [address] Phone: [phone]   Fax:  [phone]  Name: Jamie Cardenas MRN: [id number] Date of Birth: 1975-10-04

## 2018-12-31 ENCOUNTER — Ambulatory Visit (INDEPENDENT_AMBULATORY_CARE_PROVIDER_SITE_OTHER): Payer: BLUE CROSS/BLUE SHIELD | Admitting: Rehabilitative and Restorative Service Providers"

## 2018-12-31 DIAGNOSIS — M62830 Muscle spasm of back: Secondary | ICD-10-CM | POA: Diagnosis not present

## 2018-12-31 DIAGNOSIS — R293 Abnormal posture: Secondary | ICD-10-CM

## 2018-12-31 DIAGNOSIS — G8929 Other chronic pain: Secondary | ICD-10-CM

## 2018-12-31 DIAGNOSIS — M5442 Lumbago with sciatica, left side: Secondary | ICD-10-CM | POA: Diagnosis not present

## 2018-12-31 DIAGNOSIS — M5441 Lumbago with sciatica, right side: Secondary | ICD-10-CM

## 2018-12-31 NOTE — Therapy (Signed)
[place], [place], [address] Phone: [phone]   Fax:  [phone]  Physical Therapy Treatment  Patient Details  Name: Jamie Cardenas MRN: [id number] Date of Birth: [birth year] Referring Provider (PT): Sarina Ill, MD   Encounter Date: 12/31/2018  PT End of Session - 12/31/18 1734    Visit Number  5    Number of Visits  12    Date for PT Re-Evaluation  01/20/19    PT Start Time  1708    PT Stop Time  1750    PT Time Calculation (min)  42 min    Activity Tolerance  Patient tolerated treatment well    Behavior During Therapy  [facility] for tasks assessed/performed       Past Medical History:  Diagnosis Date  . Breast mass    Right breast lumps   . Cancer ([place]) 1997   cervical   . Concussion    x2  . Hypothyroid   . Interstitial cystitis     Past Surgical History:  Procedure Laterality Date  . ABDOMINAL HYSTERECTOMY    . APPENDECTOMY    . CHOLECYSTECTOMY    . TUBAL LIGATION      There were no vitals filed for this visit.  Subjective Assessment - 12/31/18 1734    Subjective  The patient notes swelling at end of the day and continued pain                       [facility] Adult PT Treatment/Exercise - 12/31/18 1755      Exercises   Exercises  Knee/Hip;Ankle      Knee/Hip Exercises: Stretches   Active Hamstring Stretch  Both    Active Hamstring Stretch Limitations  long sitting with hips on pillows    Quad Stretch  Right;Left   prone   Other Knee/Hip Stretches  Seated butterfly stretch modified to lift hips on 2 pillows for improved knee positioning; also performed       Knee/Hip Exercises: Standing   Wall Squat  10 reps;5 seconds      Modalities   Modalities  Iontophoresis      Iontophoresis   Type of Iontophoresis  Dexamethasone    Location  L pes anserine    Dose  1.0 cc    Time  6 hour patch      Manual Therapy   Manual Therapy  Joint mobilization;Soft  tissue mobilization    Manual therapy comments  for LE pain and mobility    Joint Mobilization  Prone: L hip posterior>anterior joint mobilization    Soft tissue mobilization  bilateral LEs performed sidelying (to get medial aspect of lower leg) and performed STM of gastrocs, deep soleous, and then in prone performed medial hamstring STM      Ankle Exercises: Stretches   Soleus Stretch  2 reps;30 seconds    Gastroc Stretch  2 reps;30 seconds      Ankle Exercises: Aerobic   Elliptical  2 minutes with patient noting an apprehensive feeling and medial knee joint discomfort (like a marble in gel)               PT Short Term Goals - 12/31/18 1755      PT SHORT TERM GOAL #1   Title  She will be indpendent with iniitial HEp    Baseline  PT established initial HEP for stretching hip flexors, anterior tibialis, hamstrings, and IT band.  Time  2    Period  Weeks    Status  Achieved    Target Date  11/24/18      PT SHORT TERM GOAL #2   Title  She will report pain decr 20% or more in lower back    Baseline  for PT, the patient c/o of distal symptoms greater than proximal symptoms    Time  6    Period  Weeks    Status  Deferred    Target Date  11/24/18        PT Long Term Goals - 12/30/18 1251      PT LONG TERM GOAL #1   Title  she will be indpendent with all HEP issued    Time  6    Period  Weeks    Status  On-going    Target Date  01/20/19      PT LONG TERM GOAL #2   Title  she will report pain decreased 50% and become intermittant in back and legs    Time  6    Period  Weeks    Status  On-going    Target Date  01/20/19      PT LONG TERM GOAL #3   Title  She will report sleep improved 50% or more due to decr pain    Time  6    Period  Weeks    Status  On-going    Target Date  01/20/19      PT LONG TERM GOAL #4   Title  She reports  able to sit for 45 min without increased pain.    Time  6    Period  Weeks    Status  On-going    Target Date  01/20/19             Plan - 12/31/18 1819    Clinical Impression Statement  The patient is point tender over L pes anserine.  She has negative anterior/posterior drawer test of L knee and has soft tissue tenderness and pain over L medial hamstring, point tender pes anserine, tender to palpation soleous and medial gastrocs on left side.  PT continuing with strengthening, stretching, and manual to optimize functional status. Initiated iontophoresis today over L pes anserine.    PT Treatment/Interventions  Dry needling;Passive range of motion;Manual techniques;Patient/family education;Therapeutic exercise;Electrical Stimulation;Moist Heat;Iontophoresis 4mg /ml Dexamethasone;Ultrasound;ADLs/Self Care Home Management;Joint Manipulations;Spinal Manipulations    PT Next Visit Plan  check HEP tolerance, ionto to L knee, joint mobilization L SI region, L hip, L knee self care (ice massage) and soft tissue mobilization    PT Home Exercise Plan  [place] and Agree with Plan of Care  Patient       Patient will benefit from skilled therapeutic intervention in order to improve the following deficits and impairments:  Pain, Increased muscle spasms, Decreased range of motion, Decreased activity tolerance  Visit Diagnosis: Chronic bilateral low back pain with bilateral sciatica  Abnormal posture  Muscle spasm of back     Problem List Patient Active Problem List   Diagnosis Date Noted  . Hypothyroid   . Interstitial cystitis     Caydon Feasel,Darian, PT 12/31/2018, 6:21 PM  [facility] [place], [place], [address] Phone: [phone]   Fax:  [phone]  Name: Jamie Cardenas MRN: [id number] Date of Birth: Oct 21, 1975

## 2019-01-07 ENCOUNTER — Encounter: Payer: BC Managed Care – PPO | Admitting: Rehabilitative and Restorative Service Providers"

## 2019-01-14 ENCOUNTER — Encounter: Payer: Self-pay | Admitting: Rehabilitative and Restorative Service Providers"

## 2019-01-14 ENCOUNTER — Other Ambulatory Visit: Payer: Self-pay

## 2019-01-14 ENCOUNTER — Ambulatory Visit (INDEPENDENT_AMBULATORY_CARE_PROVIDER_SITE_OTHER): Payer: BLUE CROSS/BLUE SHIELD | Admitting: Rehabilitative and Restorative Service Providers"

## 2019-01-14 DIAGNOSIS — M62830 Muscle spasm of back: Secondary | ICD-10-CM

## 2019-01-14 DIAGNOSIS — R293 Abnormal posture: Secondary | ICD-10-CM | POA: Diagnosis not present

## 2019-01-14 DIAGNOSIS — M5441 Lumbago with sciatica, right side: Secondary | ICD-10-CM

## 2019-01-14 DIAGNOSIS — G8929 Other chronic pain: Secondary | ICD-10-CM

## 2019-01-14 DIAGNOSIS — M5442 Lumbago with sciatica, left side: Secondary | ICD-10-CM

## 2019-01-14 NOTE — Therapy (Signed)
[place], [place], [address] Phone: [phone]   Fax:  [phone]  Physical Therapy Treatment  Patient Details  Name: Rhyse Biles MRN: [id number] Date of Birth: 09/14/1975 Referring Provider (PT): Sarina Ill, MD   Encounter Date: 01/14/2019  PT End of Session - 01/14/19 1704    Visit Number  6    Number of Visits  12    Date for PT Re-Evaluation  01/20/19    Authorization Type  BCBS    PT Start Time  1706    PT Stop Time  1750    PT Time Calculation (min)  44 min    Activity Tolerance  Patient tolerated treatment well    Behavior During Therapy  [facility] for tasks assessed/performed       Past Medical History:  Diagnosis Date  . Breast mass    Right breast lumps   . Cancer ([place]) 1997   cervical   . Concussion    x2  . Hypothyroid   . Interstitial cystitis     Past Surgical History:  Procedure Laterality Date  . ABDOMINAL HYSTERECTOMY    . APPENDECTOMY    . CHOLECYSTECTOMY    . TUBAL LIGATION      There were no vitals filed for this visit.  Subjective Assessment - 01/14/19 1704    Subjective  The patient saw Dr. Maureen Ralphs and notes there is a concern for torn meniscus.  She is doing some of the exercises.  She notes the knee is really painful.  She reports her knees are bothering her more than her back.  She feels she is moving different to avoid knee pain, which can make her back hurt.  The iontophoresis pad helped her knee and she got 5-7 days of relief.    Patient Stated Goals  She wants to be able to work out again    Currently in Pain?  Yes                       [facility] Adult PT Treatment/Exercise - 01/14/19 1719      Exercises   Exercises  Knee/Hip;Lumbar      Lumbar Exercises: Quadruped   Straight Leg Raise  10 reps;3 seconds    Straight Leg Raises Limitations  with foam pad to reduce knee discomfort; core stabilization tactile and verbal cues provided     Opposite Arm/Leg Raise  3 seconds;Right arm/Left leg;Left arm/Right leg;5 reps    Opposite Arm/Leg Raise Limitations  with foam pad for knee comfort      Knee/Hip Exercises: Standing   Heel Raises Limitations  Toe walking for heel raises/dynamic gait activities    Lateral Step Up  10 reps;Left    Forward Step Up  10 reps;Left    Functional Squat  10 reps    Functional Squat Limitations  4 lb weight workig on equal R/L weight distribution    SLS  L SLS abducting R LE into door frame x 10 reps with isometric holds for L VMO activation, SLS activities working on R posterior leg lift <>marching position without touching down for L leg stance control x 10 reps then repeated on the R side.    Other Standing Knee Exercises  Backwards walking with ball overhead to engage core x 20 feet x 3 reps      Knee/Hip Exercises: Supine   Straight Leg Raise with External Rotation  Strengthening;Left;10 reps    Straight  Leg Raise with External Rotation Limitations  Patient has pain with SLR with ER at 5-6/10 (which is her baseline pain so no significant change, but does report discomfort).      Iontophoresis   Type of Iontophoresis  Dexamethasone    Location  L pes anserine    Dose  1.0 cc    Time  6 hour patch               PT Short Term Goals - 12/31/18 1755      PT SHORT TERM GOAL #1   Title  She will be indpendent with iniitial HEp    Baseline  PT established initial HEP for stretching hip flexors, anterior tibialis, hamstrings, and IT band.    Time  2    Period  Weeks    Status  Achieved    Target Date  11/24/18      PT SHORT TERM GOAL #2   Title  She will report pain decr 20% or more in lower back    Baseline  for PT, the patient c/o of distal symptoms greater than proximal symptoms    Time  6    Period  Weeks    Status  Deferred    Target Date  11/24/18        PT Long Term Goals - 01/14/19 1712      PT LONG TERM GOAL #1   Title  she will be indpendent with all HEP issued     Time  6    Period  Weeks    Status  On-going      PT LONG TERM GOAL #2   Title  she will report pain decreased 50% and become intermittant in back and legs    Time  6    Period  Weeks    Status  On-going      PT LONG TERM GOAL #3   Title  She will report sleep improved 50% or more due to decr pain    Time  6    Period  Weeks    Status  On-going      PT LONG TERM GOAL #4   Title  She reports  able to sit for 45 min without increased pain.    Time  6    Period  Weeks    Status  On-going            Plan - 01/14/19 1824    Clinical Impression Statement  The patient felt improvement in L pes anserine tenderness after ionto last visit.  PT used ionto patch today.  She is scheduled to undergo MRI 01/20/19.  PT to add stance control LE strengthening and core engagement in standing activities to patient tolerance.  Also encouraging patient to begin walking program 20 minutes 3 x/week.    PT Treatment/Interventions  Dry needling;Passive range of motion;Manual techniques;Patient/family education;Therapeutic exercise;Electrical Stimulation;Moist Heat;Iontophoresis 4mg /ml Dexamethasone;Ultrasound;ADLs/Self Care Home Management;Joint Manipulations;Spinal Manipulations    PT Next Visit Plan  check HEP tolerance, ionto to L knee, joint mobilization L SI region, L hip, L knee self care (ice massage) and soft tissue mobilization    PT Home Exercise Plan  [place] and Agree with Plan of Care  Patient       Patient will benefit from skilled therapeutic intervention in order to improve the following deficits and impairments:  Pain, Increased muscle spasms, Decreased range of motion, Decreased activity tolerance  Visit Diagnosis: Chronic bilateral low  back pain with bilateral sciatica  Abnormal posture  Muscle spasm of back     Problem List Patient Active Problem List   Diagnosis Date Noted  . Hypothyroid   . Interstitial cystitis     Analisa Sledd,Adyline ,  PT 01/14/2019, 6:27 PM  [facility] [place], [place], [address] Phone: [phone]   Fax:  [phone]  Name: Alleene Funez MRN: [id number] Date of Birth: 05-28-1975

## 2019-01-20 ENCOUNTER — Encounter: Payer: BC Managed Care – PPO | Admitting: Rehabilitative and Restorative Service Providers"

## 2019-01-28 ENCOUNTER — Encounter: Payer: BC Managed Care – PPO | Admitting: Rehabilitative and Restorative Service Providers"

## 2019-02-03 ENCOUNTER — Other Ambulatory Visit: Payer: Self-pay

## 2019-02-03 ENCOUNTER — Ambulatory Visit (INDEPENDENT_AMBULATORY_CARE_PROVIDER_SITE_OTHER): Payer: BLUE CROSS/BLUE SHIELD | Admitting: Rehabilitative and Restorative Service Providers"

## 2019-02-03 ENCOUNTER — Encounter: Payer: Self-pay | Admitting: Rehabilitative and Restorative Service Providers"

## 2019-02-03 DIAGNOSIS — M5442 Lumbago with sciatica, left side: Secondary | ICD-10-CM | POA: Diagnosis not present

## 2019-02-03 DIAGNOSIS — M62830 Muscle spasm of back: Secondary | ICD-10-CM | POA: Diagnosis not present

## 2019-02-03 DIAGNOSIS — M5441 Lumbago with sciatica, right side: Secondary | ICD-10-CM | POA: Diagnosis not present

## 2019-02-03 DIAGNOSIS — R293 Abnormal posture: Secondary | ICD-10-CM | POA: Diagnosis not present

## 2019-02-03 DIAGNOSIS — G8929 Other chronic pain: Secondary | ICD-10-CM

## 2019-02-03 NOTE — Therapy (Signed)
[place], [place], [address] Phone: [phone]   Fax:  [phone]  Physical Therapy Treatment and Discharge Summary  Patient Details  Name: Jamie Cardenas MRN: [id number] Date of Birth: May 10, 1975 Referring Provider (PT): Sarina Ill, MD   Encounter Date: 02/03/2019  PT End of Session - 02/03/19 0741    Visit Number  7    Number of Visits  12    Date for PT Re-Evaluation  01/20/19    Authorization Type  BCBS    PT Start Time  0721    PT Stop Time  0750    PT Time Calculation (min)  29 min    Activity Tolerance  Patient tolerated treatment well    Behavior During Therapy  [facility] for tasks assessed/performed       Past Medical History:  Diagnosis Date  . Breast mass    Right breast lumps   . Cancer ([place]) 1997   cervical   . Concussion    x2  . Hypothyroid   . Interstitial cystitis     Past Surgical History:  Procedure Laterality Date  . ABDOMINAL HYSTERECTOMY    . APPENDECTOMY    . CHOLECYSTECTOMY    . TUBAL LIGATION      There were no vitals filed for this visit.  Subjective Assessment - 02/03/19 0723    Subjective  The patient was sore after last visit and cancelled until MRI.  She reports she has a torn meniscus in the left knee and is scheduled to undergo surgery in late January.  She has not done HEP at this time.    Diagnostic tests  recent MRI revealed    Patient Stated Goals  She wants to be able to work out again    Currently in Pain?  Yes    Pain Score  --   tingling at rest, "I'm not hurting".   Pain Location  Knee    Pain Orientation  Left    Pain Descriptors / Indicators  Sore    Pain Type  Chronic pain    Pain Onset  More than a month ago    Pain Frequency  Constant    Aggravating Factors   on feet for long periods    Pain Relieving Factors  stretching                       [facility] Adult PT Treatment/Exercise - 02/03/19 1004      Exercises   Exercises  Knee/Hip      Knee/Hip Exercises: Stretches   Active Hamstring Stretch  Left;30 seconds    Hip Flexor Stretch  Left;30 seconds    ITB Stretch  Left;30 seconds      Knee/Hip Exercises: Supine   Straight Leg Raise with External Rotation  Strengthening;Left   12 reps     Knee/Hip Exercises: Sidelying   Hip ABduction  Strengthening;Left   12 reps     Knee/Hip Exercises: Prone   Hamstring Curl  --   attempted- painful and unable to tolerate   Hip Extension  Strengthening;Left   12 reps            PT Education - 02/03/19 0958    Education Details  modified HEP with new dx of meniscus tear in mind    Person(s) Educated  Patient    Methods  Explanation;Demonstration;Handout    Comprehension  Verbalized understanding;Returned demonstration       PT  Short Term Goals - 12/31/18 1755      PT SHORT TERM GOAL #1   Title  She will be indpendent with iniitial HEp    Baseline  PT established initial HEP for stretching hip flexors, anterior tibialis, hamstrings, and IT band.    Time  2    Period  Weeks    Status  Achieved    Target Date  11/24/18      PT SHORT TERM GOAL #2   Title  She will report pain decr 20% or more in lower back    Baseline  for PT, the patient c/o of distal symptoms greater than proximal symptoms    Time  6    Period  Weeks    Status  Deferred    Target Date  11/24/18        PT Long Term Goals - 02/03/19 0749      PT LONG TERM GOAL #1   Title  she will be indpendent with all HEP issued    Time  6    Period  Weeks    Status  Achieved      PT LONG TERM GOAL #2   Title  she will report pain decreased 50% and become intermittant in back and legs    Baseline  patient to undergo surgery in late January for meniscus tear.    Time  6    Period  Weeks    Status  Not Met      PT LONG TERM GOAL #3   Title  She will report sleep improved 50% or more due to decr pain    Time  6    Period  Weeks    Status  Not Met      PT LONG TERM GOAL  #4   Title  She reports  able to sit for 45 min without increased pain.    Time  6    Period  Weeks    Status  Not Met            Plan - 02/03/19 0756    Clinical Impression Statement  The patient met LTG for home exercise.  She did not meet other goals and had imaging revealing L meniscus tear.  Patient to continue home exerciss until surgery for general LE strengthening (open chain/ painf ree).    PT Treatment/Interventions  Dry needling;Passive range of motion;Manual techniques;Patient/family education;Therapeutic exercise;Electrical Stimulation;Moist Heat;Iontophoresis 93m/ml Dexamethasone;Ultrasound;ADLs/Self Care Home Management;Joint Manipulations;Spinal Manipulations    PT Next Visit Plan  Discharge today    PT Home Exercise Plan  [place] Agree with Plan of Care  Patient       Patient will benefit from skilled therapeutic intervention in order to improve the following deficits and impairments:  Pain, Increased muscle spasms, Decreased range of motion, Decreased activity tolerance  Visit Diagnosis: Chronic bilateral low back pain with bilateral sciatica  Abnormal posture  Muscle spasm of back    PHYSICAL THERAPY DISCHARGE SUMMARY  Visits from Start of Care: 7  Current functional level related to goals / functional outcomes: See above   Remaining deficits: Continued knee pain- recent imaging revealed meniscus tear   Education / Equipment: Home exercise prior to surgery  Plan: Patient agrees to discharge.  Patient goals were not met. Patient is being discharged due to a change in medical status.  ?????      Problem List Patient Active Problem List   Diagnosis Date Noted  .  Hypothyroid   . Interstitial cystitis      Thank you for the referral of this patient. Rudell Cobb, MPT  [place] , PT 02/03/2019, 10:06 AM  [facility] [place], [place],  [address] Phone: [phone]   Fax:  [phone]  Name: Billie Trager MRN: [id number] Date of Birth: 12/19/1975

## 2019-02-03 NOTE — Patient Instructions (Signed)
Access Code: [email or web address]  URL: [email or web address]  Date: 02/03/2019  Prepared by: Rudell Cobb   Exercises Hip Flexor Stretch at [facility] of Bed - 2-3 reps - 1 sets - 30 seconds hold - 2x daily - 7x weekly Sidelying ITB Stretch off Table - 2-3 reps - 1 sets - 30 seconds hold - 2x daily - 7x weekly Supine Hamstring Stretch with Strap - 2-3 reps - 1 sets - 30 seconds hold - 2x daily - 7x weekly Sidelying Hip Abduction - 15 reps - 2 sets - 2x daily - 7x weekly Prone Hip Extension - 15 reps - 2 sets - 2x daily - 7x weekly Supine Active Straight Leg Raise - 15 reps - 2 sets - 2x daily - 7x weekly
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
## 2019-08-04 ENCOUNTER — Encounter: Payer: Self-pay | Admitting: Neurology

## 2022-09-17 ENCOUNTER — Ambulatory Visit: Payer: BC Managed Care – PPO | Admitting: Student

## 2022-09-17 ENCOUNTER — Encounter: Payer: Self-pay | Admitting: Student

## 2022-09-17 VITALS — BP 108/62 | HR 83 | Temp 98.0°F | Resp 18 | Ht 64.0 in | Wt 143.6 lb

## 2022-09-17 DIAGNOSIS — J4540 Moderate persistent asthma, uncomplicated: Secondary | ICD-10-CM

## 2022-09-17 DIAGNOSIS — Z6824 Body mass index (BMI) 24.0-24.9, adult: Secondary | ICD-10-CM | POA: Diagnosis not present

## 2022-09-17 DIAGNOSIS — E038 Other specified hypothyroidism: Secondary | ICD-10-CM

## 2022-09-17 DIAGNOSIS — J449 Chronic obstructive pulmonary disease, unspecified: Secondary | ICD-10-CM

## 2022-09-17 DIAGNOSIS — Z0000 Encounter for general adult medical examination without abnormal findings: Secondary | ICD-10-CM | POA: Diagnosis not present

## 2022-09-17 DIAGNOSIS — J45909 Unspecified asthma, uncomplicated: Secondary | ICD-10-CM | POA: Diagnosis not present

## 2022-09-17 DIAGNOSIS — E063 Autoimmune thyroiditis: Secondary | ICD-10-CM

## 2022-09-17 DIAGNOSIS — J4489 Other specified chronic obstructive pulmonary disease: Secondary | ICD-10-CM | POA: Insufficient documentation

## 2022-09-17 MED ORDER — AIRSUPRA 90-80 MCG/ACT IN AERO
2.0000 | INHALATION_SPRAY | Freq: Every day | RESPIRATORY_TRACT | 1 refills | Status: DC | PRN
Start: 2022-09-17 — End: 2022-09-19

## 2022-09-17 MED ORDER — EPINEPHRINE 0.3 MG/0.3ML IJ SOAJ: 0.3000 mg | INTRAMUSCULAR | 0 refills | Status: DC | PRN

## 2022-09-17 NOTE — Assessment & Plan Note (Signed)
She will continue to see Endocrinology. I have drawn the TSH, T3 and T4 today.

## 2022-09-17 NOTE — Progress Notes (Addendum)
Patient: Jamie Cardenas, Female    DOB: [birth year], 47 y.o.   MRN: [id number] System, Provider Not In Visit Date: 09/17/2022   Chief Complaint  Patient presents with   Annual Exam   Subjective:   Annual physical exam:  Jamie Cardenas is a 47 y.o. female who presents today for complete physical exam: [facility] in [place] where she is receiving testosterone and trizepatide  Acute other: The patient says that she wakes up frequently at night due to not being able to breathe.  She is having to use her inhaler when these events occur.  She does report history of exercise-induced asthma as a child.  However these are new symptoms.  Spirometry will be completed today in office.   Exercise/Activity:  daily 5/ days weekly 30 min to 1 hour Diet/nutrition: low carb high protein Sleep: sleep is better since starting hormone therapy but has woken up with asthma attacks recently.  Last Eye Exam:  2023 Dental Exam: 5 years    SDOH Screenings   Depression (PHQ2-9): Low Risk  (09/17/2022)  Tobacco Use: Low Risk  (09/17/2022)     USPSTF grade A and B recommendations - reviewed and addressed today  Depression:  Phq 9 completed today by patient, was reviewed by me with patient in the room PHQ score is 0, pt feels no concerns     09/17/2022    9:06 AM  PHQ 2/9 Scores  PHQ - 2 Score 0      09/17/2022    9:06 AM  Depression screen PHQ 2/9  Decreased Interest 0  Down, Depressed, Hopeless 0  PHQ - 2 Score 0    Alcohol screening: a glass of wine at some points.     Immunizations and Health Maintenance: Health Maintenance  Topic Date Due   PAP SMEAR-Modifier  10/10/2017   Colonoscopy  Never done   COVID-19 Vaccine (1 - 2023-24 season) 10/03/2022 (Originally 10/26/2021)   INFLUENZA VACCINE  09/26/2022   DTaP/Tdap/Td (2 - Td or Tdap) 02/08/2031   Hepatitis C Screening  Completed   HIV Screening  Completed   HPV VACCINES  Aged Out      Immunizations: Immunization History   Administered Date(s) Administered   Tdap 02/07/2021   HPV: up to at age 59 , ask insurance if age between 13-45  Shingrix: 13-64 yo and ask insurance if covered when patient above 65 yo Pneumonia:  educated and discussed with patient. Flu:  educated and discussed with patient. COVID:  declined    Hep C Screening: 2014  STD testing and prevention (HIV/chl/gon/syphilis):  declines   Intimate partner violence:none   Sexual History/Pain during Intercourse: Married  Menstrual History/LMP/Abnormal Bleeding:  No LMP recorded. Patient has had a hysterectomy.  Incontinence Symptoms: none   Breast cancer:  Last Mammogram: last year due today  BRCA gene screening: her aunt but it was benign    Cervical cancer screening: hysterectomy  Pt has family hx of cancers - breast, ovarian, uterine, colon:     Osteoporosis:   Discussion on osteoporosis per age, including high calcium and vitamin D supplementation, weight bearing exercises Pt is supplementing with daily calcium/Vit D.  Roughly experienced menopause at age : perimenopause   Skin cancer:  Hx of skin CA -  NO Discussed atypical lesions none   Colorectal cancer:   Colonoscopy is due will order family hx    Discussed concerning signs and sx of CRC  Lung cancer:   Low Dose CT Chest  recommended if Age 32-80 years, 20 pack-year currently smoking OR have quit w/in 15years. Patient does not qualify.    Social History   Tobacco Use   Smoking status: Never   Smokeless tobacco: Never  Vaping Use   Vaping status: Never Used  Substance Use Topics   Alcohol use: No   Drug use: No       Family History  Problem Relation Age of Onset   Breast cancer Maternal Aunt    Diabetes Maternal Aunt    Neuropathy Maternal Aunt    Cancer Maternal Grandfather    Heart disease Other    Cancer Maternal Uncle    Cancer Mother        cervical   Other Mother        leg tumor, benign     Blood pressure/Hypertension: BP Readings from  Last 3 Encounters:  09/17/22 108/62  10/29/18 120/74  07/11/16 104/67     Social History       Social History   Socioeconomic History   Marital status: Married    Spouse name: Not on file   Number of children: 2   Years of education: Not on file   Highest education level: Not on file  Occupational History   Not on file  Tobacco Use   Smoking status: Never   Smokeless tobacco: Never  Vaping Use   Vaping status: Never Used  Substance and Sexual Activity   Alcohol use: No   Drug use: No   Sexual activity: Not on file  Other Topics Concern   Not on file  Social History Narrative   Lives at home with husband and children   Right handed   Social Determinants of Health   Financial Resource Strain: Not on file  Food Insecurity: Not on file  Transportation Needs: Not on file  Physical Activity: Not on file  Stress: Not on file  Social Connections: Not on file    Family History        Family History  Problem Relation Age of Onset   Breast cancer Maternal Aunt    Diabetes Maternal Aunt    Neuropathy Maternal Aunt    Cancer Maternal Grandfather    Heart disease Other    Cancer Maternal Uncle    Cancer Mother        cervical   Other Mother        leg tumor, benign    Patient Active Problem List   Diagnosis Date Noted   Annual physical exam 09/17/2022   Asthma with irreversible airway obstruction (HCC) 09/17/2022   Hypothyroid    Interstitial cystitis     Past Surgical History:  Procedure Laterality Date   ABDOMINAL HYSTERECTOMY     ABDOMINOPLASTY     APPENDECTOMY     CHOLECYSTECTOMY     TUBAL LIGATION       Current Outpatient Medications:    Albuterol-Budesonide (AIRSUPRA) 90-80 MCG/ACT AERO, Inhale 2 puffs into the lungs daily as needed., Disp: 5.9 g, Rfl: 1   cholecalciferol (VITAMIN D3) 25 MCG (1000 UNIT) tablet, Take 1,000 Units by mouth in the morning and at bedtime., Disp: , Rfl:    albuterol (PROAIR HFA) 108 (90 Base) MCG/ACT inhaler, Inhale  2 puffs into the lungs every 4 (four) hours as needed., Disp: , Rfl:    Ascorbic Acid (VITAMIN C CR) 500 MG CPCR, Take 1 capsule by mouth daily., Disp: , Rfl:    BIOTIN PO, Take 20,000 Units by mouth., Disp: ,  Rfl:    CALCIUM PO, Take 1,200 mg by mouth., Disp: , Rfl:    cetirizine (ZYRTEC) 10 MG tablet, Take 10 mg by mouth daily., Disp: , Rfl:    EPINEPHrine 0.3 mg/0.3 mL IJ SOAJ injection, Inject 0.3 mg into the muscle as needed for anaphylaxis., Disp: 1 each, Rfl: 0   Flaxseed, Linseed, (FLAX SEEDS PO), Take 2 tablets by mouth daily., Disp: , Rfl:    ibuprofen (ADVIL) 800 MG tablet, Take 800 mg by mouth 3 (three) times daily as needed., Disp: , Rfl:    MAGNESIUM PO, Take 1 tablet by mouth daily., Disp: , Rfl:    Multiple Vitamins-Minerals (ZINC PO), Take 50 mg by mouth daily., Disp: , Rfl:    thyroid (ARMOUR) 180 MG tablet, 90 mg on Mon, Wed, Fri, Sun 180 mg on Tues, Thurs, Sat, Disp: , Rfl:    UNABLE TO FIND, Med Name: Bioactive Synergy Has: Black Cumin 2,000; Turmeric Curcumin 1,000; Black Pepperfruit 10 mg, Disp: , Rfl:    VITAMIN D PO, Take 15,000 Units by mouth daily., Disp: , Rfl:    vitamin E 1000 UNIT capsule, Take 1,000 Units by mouth daily., Disp: , Rfl:   Allergies  Allergen Reactions   Hydrocodone Other (See Comments)   Latex Rash   Shellfish Allergy Anaphylaxis    Not allergic to iodine or IVP dye   Vicodin [Hydrocodone-Acetaminophen]     Patient Care Team: System, Provider Not In as PCP - General  Advanced Care Planning:  A voluntary discussion about advance care planning including the explanation and discussion of advance directives.   Discussed health care proxy and Living will, and the patient was able to identify a health care proxy as [place].   Patient does not have a living will at present time.    Review of Systems Review of Systems  Constitutional: Negative.   Eyes: Negative.   Respiratory:  Positive for shortness of breath.   Cardiovascular:  Negative.   Gastrointestinal: Negative.   Genitourinary: Negative.   Musculoskeletal: Negative.   Neurological: Negative.   Endo/Heme/Allergies: Negative.   Psychiatric/Behavioral: Negative.            Objective:   Vitals:   BP 108/62 (BP Location: Left Arm, Patient Position: Sitting, Cuff Size: Normal)   Pulse 83   Temp 98 F (36.7 C) (Temporal)   Resp 18   Ht 5\' 4"  (1.626 m)   Wt 143 lb 9.6 oz (65.1 kg)   SpO2 98%   BMI 24.65 kg/m    Physical Examination Physical Exam Vitals reviewed.  Constitutional:      Appearance: Normal appearance. She is normal weight.  HENT:     Head: Normocephalic and atraumatic.     Nose: Nose normal.     Mouth/Throat:     Mouth: Mucous membranes are dry.  Eyes:     Extraocular Movements: Extraocular movements intact.     Conjunctiva/sclera: Conjunctivae normal.     Pupils: Pupils are equal, round, and reactive to light.  Cardiovascular:     Rate and Rhythm: Normal rate and regular rhythm.     Pulses: Normal pulses.     Heart sounds: Normal heart sounds. No murmur heard. Pulmonary:     Effort: Pulmonary effort is normal. No respiratory distress.     Breath sounds: Normal breath sounds. No wheezing.  Abdominal:     General: Abdomen is flat. Bowel sounds are normal.     Palpations: Abdomen is soft.  Musculoskeletal:  General: Normal range of motion.     Cervical back: Normal range of motion and neck supple.  Skin:    General: Skin is warm and dry.     Capillary Refill: Capillary refill takes less than 2 seconds.  Neurological:     General: No focal deficit present.     Mental Status: She is alert and oriented to person, place, and time.  Psychiatric:        Mood and Affect: Mood normal.        Behavior: Behavior normal.          Assessment & Plan:     Annual physical exam CPE completed today   USPSTF grade A and B recommendations reviewed with patient; age-appropriate recommendations, preventive care, screening  tests, etc discussed and encouraged; healthy living encouraged; see AVS for patient education given to patient   Discussed importance of 150 minutes of physical activity weekly, AHA exercise recommendations given to pt in AVS/handout   Discussed importance of healthy diet:  eating lean meats and proteins, avoiding trans fats and saturated fats, avoid simple sugars and excessive carbs in diet, eat 6 servings of fruit/vegetables daily and drink plenty of water and avoid sweet beverages.     Recommended pt to do annual eye exam and routine dental exams/cleanings   Depression, alcohol, fall screening completed as documented above and per flowsheets   Advance Care planning information and packet discussed and offered today, encouraged pt to discuss with family members/spouse/partner/friends and complete Advanced directive packet and bring copy to office   Needs Colonoscopy and Mammogram. Referrals sent.     Hypothyroid She will continue to see Endocrinology. I have drawn the TSH, T3 and T4 today.   Asthma with irreversible airway obstruction (HCC) Spirometry completed in office today showed moderate obstruction.  I will refer her to pulmonology for sleep study and further evaluation.  I will change her albuterol to Airsupra- sample given today.      No follow-ups on file.      Edwena Blow, NP 09/17/22 12:36 PM

## 2022-09-17 NOTE — Addendum Note (Signed)
Addended by: Edwena Blow on: 09/17/2022 12:38 PM   Modules accepted: Orders

## 2022-09-17 NOTE — Assessment & Plan Note (Signed)
Spirometry completed in office today showed moderate obstruction.  I will refer her to pulmonology for sleep study and further evaluation.  I will change her albuterol to Airsupra- sample given today.

## 2022-09-17 NOTE — Assessment & Plan Note (Deleted)
Spirometry completed in office today showed moderate obstruction.  I will refer her to pulmonology for sleep study and further evaluation.  I will change her albuterol to Airsupra- sample given today.

## 2022-09-17 NOTE — Assessment & Plan Note (Addendum)
CPE completed today   USPSTF grade A and B recommendations reviewed with patient; age-appropriate recommendations, preventive care, screening tests, etc discussed and encouraged; healthy living encouraged; see AVS for patient education given to patient   Discussed importance of 150 minutes of physical activity weekly, AHA exercise recommendations given to pt in AVS/handout   Discussed importance of healthy diet:  eating lean meats and proteins, avoiding trans fats and saturated fats, avoid simple sugars and excessive carbs in diet, eat 6 servings of fruit/vegetables daily and drink plenty of water and avoid sweet beverages.     Recommended pt to do annual eye exam and routine dental exams/cleanings   Depression, alcohol, fall screening completed as documented above and per flowsheets   Advance Care planning information and packet discussed and offered today, encouraged pt to discuss with family members/spouse/partner/friends and complete Advanced directive packet and bring copy to office   Needs Colonoscopy and Mammogram. Referrals sent.

## 2022-09-18 ENCOUNTER — Other Ambulatory Visit: Payer: Self-pay | Admitting: Student

## 2022-09-18 ENCOUNTER — Telehealth: Payer: Self-pay

## 2022-09-18 DIAGNOSIS — J45909 Unspecified asthma, uncomplicated: Secondary | ICD-10-CM

## 2022-09-18 LAB — LIPID PANEL
Chol/HDL Ratio: 2.4 ratio (ref 0.0–4.4)
Cholesterol, Total: 157 mg/dL (ref 100–199)
HDL: 66 mg/dL (ref >39)
LDL Chol Calc (NIH): 83 mg/dL (ref 0–99)
Triglycerides: 34 mg/dL (ref 0–149)
VLDL Cholesterol Cal: 8 mg/dL (ref 5–40)

## 2022-09-18 LAB — HEMOGLOBIN A1C
Est. average glucose Bld gHb Est-mCnc: 97 mg/dL
Hgb A1c MFr Bld: 5 % (ref 4.8–5.6)

## 2022-09-18 LAB — T4, FREE: Free T4: 0.8 ng/dL — ABNORMAL LOW (ref 0.82–1.77)

## 2022-09-18 LAB — CBC WITH DIFFERENTIAL/PLATELET
Basophils Absolute: 0.1 10*3/uL (ref 0.0–0.2)
Basos: 1 %
EOS (ABSOLUTE): 0.3 10*3/uL (ref 0.0–0.4)
Eos: 8 %
Hematocrit: 40.2 % (ref 34.0–46.6)
Hemoglobin: 14 g/dL (ref 11.1–15.9)
Immature Grans (Abs): 0 10*3/uL (ref 0.0–0.1)
Immature Granulocytes: 0 %
Lymphocytes Absolute: 1.6 10*3/uL (ref 0.7–3.1)
Lymphs: 39 %
MCH: 31.6 pg (ref 26.6–33.0)
MCHC: 34.8 g/dL (ref 31.5–35.7)
MCV: 91 fL (ref 79–97)
Monocytes Absolute: 0.3 10*3/uL (ref 0.1–0.9)
Monocytes: 8 %
Neutrophils Absolute: 1.8 10*3/uL (ref 1.4–7.0)
Neutrophils: 44 %
Platelets: 260 10*3/uL (ref 150–450)
RBC: 4.43 x10E6/uL (ref 3.77–5.28)
RDW: 11.9 % (ref 11.7–15.4)
WBC: 4.1 10*3/uL (ref 3.4–10.8)

## 2022-09-18 LAB — TSH: TSH: 4.37 u[IU]/mL (ref 0.450–4.500)

## 2022-09-18 LAB — VITAMIN D 25 HYDROXY (VIT D DEFICIENCY, FRACTURES): Vit D, 25-Hydroxy: 93.3 ng/mL (ref 30.0–100.0)

## 2022-09-18 LAB — COMPREHENSIVE METABOLIC PANEL
ALT: 27 IU/L (ref 0–32)
AST: 22 IU/L (ref 0–40)
Albumin: 4.5 g/dL (ref 3.9–4.9)
Alkaline Phosphatase: 65 IU/L (ref 44–121)
BUN/Creatinine Ratio: 22 (ref 9–23)
BUN: 13 mg/dL (ref 6–24)
Bilirubin Total: 0.7 mg/dL (ref 0.0–1.2)
CO2: 25 mmol/L (ref 20–29)
Calcium: 9.5 mg/dL (ref 8.7–10.2)
Chloride: 101 mmol/L (ref 96–106)
Creatinine, Ser: 0.59 mg/dL (ref 0.57–1.00)
Globulin, Total: 2 g/dL (ref 1.5–4.5)
Glucose: 86 mg/dL (ref 70–99)
Potassium: 4.8 mmol/L (ref 3.5–5.2)
Sodium: 142 mmol/L (ref 134–144)
Total Protein: 6.5 g/dL (ref 6.0–8.5)
eGFR: 112 mL/min/{1.73_m2} (ref >59)

## 2022-09-18 LAB — T3, FREE: T3, Free: 2.5 pg/mL (ref 2.0–4.4)

## 2022-09-18 NOTE — Progress Notes (Signed)
Please notify that patient that I have reviewed their labs. Finding indicate normal levels Including cholesterol, blood sugar, and blood count.  Your free T4 is barley low at 0.80.

## 2022-09-18 NOTE — Telephone Encounter (Signed)
-----   Message from [place] sent at 09/18/2022 10:17 AM EDT ----- Please notify that patient that I have reviewed their labs. Finding indicate normal levels Including cholesterol, blood sugar, and blood count.  Your free T4 is barley low at 0.80.

## 2022-09-18 NOTE — Progress Notes (Signed)
Patient called.  Patient aware.  

## 2022-09-18 NOTE — Telephone Encounter (Signed)
Pt notified of lab results and we will also send for a pulmonary referral for the sleep study. Pt agrees

## 2022-10-29 ENCOUNTER — Encounter: Payer: Self-pay | Admitting: Gastroenterology

## 2022-11-05 ENCOUNTER — Telehealth: Payer: Self-pay

## 2022-11-05 NOTE — Telephone Encounter (Signed)
Patient has been rescheduled for 9/12 at 12:30 pm. Please advise, thank you.

## 2022-11-05 NOTE — Telephone Encounter (Signed)
Patient no showed 1030 appt.  Called at 1030 & 1041, left message to call back and reschedule pre visit with nurse by 5pm today or the colonoscopy on 11/26/22 would be cancelled.

## 2022-11-07 ENCOUNTER — Ambulatory Visit (AMBULATORY_SURGERY_CENTER): Payer: Self-pay

## 2022-11-07 VITALS — Ht 64.0 in | Wt 144.0 lb

## 2022-11-07 DIAGNOSIS — Z1211 Encounter for screening for malignant neoplasm of colon: Secondary | ICD-10-CM

## 2022-11-07 MED ORDER — NA SULFATE-K SULFATE-MG SULF 17.5-3.13-1.6 GM/177ML PO SOLN
1.0000 | Freq: Once | ORAL | 0 refills | Status: AC
Start: 2022-11-07 — End: 2022-11-07

## 2022-11-07 NOTE — Progress Notes (Signed)

## 2022-11-20 ENCOUNTER — Encounter: Payer: Self-pay | Admitting: Gastroenterology

## 2022-11-26 ENCOUNTER — Ambulatory Visit: Payer: BC Managed Care – PPO | Admitting: Gastroenterology

## 2022-11-26 ENCOUNTER — Encounter: Payer: Self-pay | Admitting: Gastroenterology

## 2022-11-26 VITALS — BP 115/76 | HR 67 | Temp 97.3°F | Resp 12 | Ht 64.0 in | Wt 144.0 lb

## 2022-11-26 DIAGNOSIS — Z1211 Encounter for screening for malignant neoplasm of colon: Secondary | ICD-10-CM

## 2022-11-26 DIAGNOSIS — D123 Benign neoplasm of transverse colon: Secondary | ICD-10-CM | POA: Diagnosis not present

## 2022-11-26 DIAGNOSIS — K635 Polyp of colon: Secondary | ICD-10-CM | POA: Diagnosis not present

## 2022-11-26 MED ORDER — SODIUM CHLORIDE 0.9 % IV SOLN: 500.0000 mL | Freq: Once | INTRAVENOUS | Status: DC

## 2022-11-26 MED ORDER — ONDANSETRON HCL 4 MG PO TABS: ORAL_TABLET | ORAL | 0 refills | Status: AC

## 2022-11-26 MED ORDER — SUTAB 1479-225-188 MG PO TABS: 24.0000 | ORAL_TABLET | Freq: Once | ORAL | 0 refills | Status: AC

## 2022-11-26 NOTE — Progress Notes (Signed)
To pacu, VSS. Report to [email or web address] 

## 2022-11-26 NOTE — Patient Instructions (Addendum)
Continue present medications. For future colonoscopy the patient will require an extended preparation. If there are any questions, please contact the gastroenterologist.  Repeat colonoscopy at the next available appointment because the bowel preparation was suboptimal.                                                     YOU HAD AN ENDOSCOPIC PROCEDURE TODAY AT THE [facility]:   Refer to the procedure report that was given to you for any specific questions about what was found during the examination.  If the procedure report does not answer your questions, please call your gastroenterologist to clarify.  If you requested that your care partner not be given the details of your procedure findings, then the procedure report has been included in a sealed envelope for you to review at your convenience later.  YOU SHOULD EXPECT: Some feelings of bloating in the abdomen. Passage of more gas than usual.  Walking can help get rid of the air that was put into your GI tract during the procedure and reduce the bloating. If you had a lower endoscopy (such as a colonoscopy or flexible sigmoidoscopy) you may notice spotting of blood in your stool or on the toilet paper. If you underwent a bowel prep for your procedure, you may not have a normal bowel movement for a few days.  Please Note:  You might notice some irritation and congestion in your nose or some drainage.  This is from the oxygen used during your procedure.  There is no need for concern and it should clear up in a day or so.  SYMPTOMS TO REPORT IMMEDIATELY:  Following lower endoscopy (colonoscopy or flexible sigmoidoscopy):  Excessive amounts of blood in the stool  Significant tenderness or worsening of abdominal pains  Swelling of the abdomen that is new, acute  Fever of 100F or higher For urgent or emergent issues, a gastroenterologist can be reached at any hour by calling [phone]. Do not use MyChart messaging for urgent  concerns.    DIET:  We do recommend a small meal at first, but then you may proceed to your regular diet.  Drink plenty of fluids but you should avoid alcoholic beverages for 24 hours.  ACTIVITY:  You should plan to take it easy for the rest of today and you should NOT DRIVE or use heavy machinery until tomorrow (because of the sedation medicines used during the test).    FOLLOW UP: Our staff will call the number listed on your records the next business day following your procedure.  We will call around 7:15- 8:00 am to check on you and address any questions or concerns that you may have regarding the information given to you following your procedure. If we do not reach you, we will leave a message.     If any biopsies were taken you will be contacted by phone or by letter within the next 1-3 weeks.  Please call us at [phone] if you have not heard about the biopsies in 3 weeks.    SIGNATURES/CONFIDENTIALITY: You and/or your care partner have signed paperwork which will be entered into your electronic medical record.  These signatures attest to the fact that that the information above on your After Visit Summary has been reviewed and is understood.  Full responsibility of  the confidentiality of this discharge information lies with you and/or your care-partner.

## 2022-11-26 NOTE — Progress Notes (Signed)
[facility] History and Physical   Primary Care Physician:  Edwena Blow, NP   Reason for Procedure:  Colorectal cancer screening  Plan:    Screening colonoscopy with possible interventions as needed     HPI: Jamie Cardenas is a very pleasant 47 y.o. female here for screening colonoscopy. Denies any nausea, vomiting, abdominal pain, melena or bright red blood per rectum  The risks and benefits as well as alternatives of endoscopic procedure(s) have been discussed and reviewed. All questions answered. The patient agrees to proceed.    Past Medical History:  Diagnosis Date   Allergy    Anemia    Asthma    Breast mass    Right breast lumps    Cancer (HCC) 1997   cervical    Concussion    x2   Hypothyroid    Interstitial cystitis     Past Surgical History:  Procedure Laterality Date   ABDOMINAL HYSTERECTOMY     ABDOMINOPLASTY     APPENDECTOMY     CHOLECYSTECTOMY     TUBAL LIGATION      Prior to Admission medications   Medication Sig Start Date End Date Taking? Authorizing Provider  albuterol (PROAIR HFA) 108 (90 Base) MCG/ACT inhaler Inhale 2 puffs into the lungs every 4 (four) hours as needed. Patient not taking: Reported on 11/07/2022    [provider]  Albuterol-Budesonide (AIRSUPRA) 90-80 MCG/ACT AERO INHALE 2 PUFFS INTO THE LUNGS DAILY AS NEEDED. 09/19/22   Eloisa Northern, MD  Ascorbic Acid (VITAMIN C CR) 500 MG CPCR Take 1 capsule by mouth daily.    [provider]  BIOTIN PO Take 20,000 Units by mouth.    [provider]  CALCIUM PO Take 1,200 mg by mouth. Patient not taking: Reported on 11/07/2022    [provider]  cetirizine (ZYRTEC) 10 MG tablet Take 10 mg by mouth daily. Patient not taking: Reported on 11/07/2022    [provider]  cholecalciferol (VITAMIN D3) 25 MCG (1000 UNIT) tablet Take 1,000 Units by mouth in the morning and at bedtime. 05/24/15   [provider]  EPINEPHRINE 0.3 mg/0.3  mL IJ SOAJ injection INJECT 0.3 MG INTO THE MUSCLE AS NEEDED FOR ANAPHYLAXIS. 09/19/22   Eloisa Northern, MD  fexofenadine (ALLEGRA ODT) 30 MG disintegrating tablet Take 30 mg by mouth daily.    [provider]  Flaxseed, Linseed, (FLAX SEEDS PO) Take 2 tablets by mouth daily.    [provider]  ibuprofen (ADVIL) 800 MG tablet Take 800 mg by mouth 3 (three) times daily as needed.    [provider]  MAGNESIUM PO Take 1 tablet by mouth daily.    [provider]  Multiple Vitamins-Minerals (ZINC PO) Take 50 mg by mouth daily.    [provider]  phentermine (ADIPEX-P) 37.5 MG tablet TAKE 1 TABLET BY MOUTH 30 MINUTES PRIOR TO FIRST MEAL    [provider]  thyroid (ARMOUR) 180 MG tablet 90 mg on Mon, Wed, Fri, Sun 180 mg on Tues, Thurs, Sat    [provider]  tirzepatide [facility]) 2.5 MG/0.5ML Pen Inject 2.5 mg into the skin once a week.    [provider]  UNABLE TO FIND Med Name: Bioactive Synergy Has: Black Cumin 2,000; Turmeric Curcumin 1,000; Black Pepperfruit 10 mg Patient not taking: Reported on 11/07/2022      VITAMIN D PO Take 15,000 Units by mouth daily. Patient not taking: Reported on 11/07/2022    [provider]  vitamin E 1000 UNIT capsule Take 1,000 Units by mouth daily. Patient not taking: Reported on 11/07/2022    [provider]    Current Outpatient Medications  Medication Sig Dispense Refill   albuterol (PROAIR HFA) 108 (90 Base) MCG/ACT inhaler Inhale 2 puffs into the lungs every 4 (four) hours as needed. (Patient not taking: Reported on 11/07/2022)     Albuterol-Budesonide (AIRSUPRA) 90-80 MCG/ACT AERO INHALE 2 PUFFS INTO THE LUNGS DAILY AS NEEDED. 90 g 1   Ascorbic Acid (VITAMIN C CR) 500 MG CPCR Take 1 capsule by mouth daily.     BIOTIN PO Take 20,000 Units by mouth.     CALCIUM PO Take 1,200 mg by mouth. (Patient not taking: Reported on 11/07/2022)     cetirizine (ZYRTEC) 10 MG tablet  Take 10 mg by mouth daily. (Patient not taking: Reported on 11/07/2022)     cholecalciferol (VITAMIN D3) 25 MCG (1000 UNIT) tablet Take 1,000 Units by mouth in the morning and at bedtime.     EPINEPHRINE 0.3 mg/0.3 mL IJ SOAJ injection INJECT 0.3 MG INTO THE MUSCLE AS NEEDED FOR ANAPHYLAXIS. 2 each 0   fexofenadine (ALLEGRA ODT) 30 MG disintegrating tablet Take 30 mg by mouth daily.     Flaxseed, Linseed, (FLAX SEEDS PO) Take 2 tablets by mouth daily.     ibuprofen (ADVIL) 800 MG tablet Take 800 mg by mouth 3 (three) times daily as needed.     MAGNESIUM PO Take 1 tablet by mouth daily.     Multiple Vitamins-Minerals (ZINC PO) Take 50 mg by mouth daily.     phentermine (ADIPEX-P) 37.5 MG tablet TAKE 1 TABLET BY MOUTH 30 MINUTES PRIOR TO FIRST MEAL     thyroid (ARMOUR) 180 MG tablet 90 mg on Mon, Wed, Fri, Sun 180 mg on Tues, Thurs, Sat     tirzepatide [facility]) 2.5 MG/0.5ML Pen Inject 2.5 mg into the skin once a week.     UNABLE TO FIND Med Name: Bioactive Synergy Has: Black Cumin 2,000; Turmeric Curcumin 1,000; Black Pepperfruit 10 mg (Patient not taking: Reported on 11/07/2022)     VITAMIN D PO Take 15,000 Units by mouth daily. (Patient not taking: Reported on 11/07/2022)     vitamin E 1000 UNIT capsule Take 1,000 Units by mouth daily. (Patient not taking: Reported on 11/07/2022)     Current Facility-Administered Medications  Medication Dose Route Frequency Provider Last Rate Last Admin   0.9 %  sodium chloride infusion  500 mL Intravenous Once Napoleon Form, MD        Allergies as of 11/26/2022 - Review Complete 11/26/2022  Allergen Reaction Noted   Hydrocodone Other (See Comments) 09/17/2022   Latex Rash 07/11/2016   Shellfish allergy Anaphylaxis 03/29/2012   Dilaudid [hydromorphone] Other (See Comments) 11/07/2022   Vicodin [hydrocodone-acetaminophen]  10/29/2018    Family History  Problem Relation Age of Onset   Cancer Mother        cervical   Other Mother        leg  tumor, benign   Breast cancer Maternal Aunt    Diabetes Maternal Aunt    Neuropathy Maternal Aunt    Cancer Maternal Uncle    Cancer Maternal Grandfather    Rectal cancer Other    Heart disease Other    Esophageal cancer Neg Hx    Stomach cancer Neg Hx     Social History   Socioeconomic History   Marital status: Married    Spouse  name: Not on file   Number of children: 2   Years of education: Not on file   Highest education level: Not on file  Occupational History   Not on file  Tobacco Use   Smoking status: Never   Smokeless tobacco: Never  Vaping Use   Vaping status: Never Used  Substance and Sexual Activity   Alcohol use: No   Drug use: No   Sexual activity: Not on file  Other Topics Concern   Not on file  Social History Narrative   Lives at home with husband and children   Right handed   Social Determinants of Health   Financial Resource Strain: Not on file  Food Insecurity: Low Risk  (10/03/2022)   Received from [facility]   Hunger Vital Sign    Worried About Running Out of Food in the Last Year: Never true    Ran Out of Food in the Last Year: Never true  Transportation Needs: Not on file (10/03/2022)  Physical Activity: Not on file  Stress: Not on file  Social Connections: Not on file  Intimate Partner Violence: Not on file    Review of Systems:  All other review of systems negative except as mentioned in the HPI.  Physical Exam: Vital signs in last 24 hours: BP 111/66   Pulse 75   Temp (!) 97.3 F (36.3 C) (Temporal)   Ht 5\' 4"  (1.626 m)   Wt 144 lb (65.3 kg)   SpO2 100%   BMI 24.72 kg/m  General:   Alert, NAD Lungs:  Clear .   Heart:  Regular rate and rhythm Abdomen:  Soft, nontender and nondistended. Neuro/Psych:  Alert and cooperative. Normal mood and affect. A and O x 3  Reviewed labs, radiology imaging, old records and pertinent past GI work up  Patient is appropriate for planned procedure(s) and anesthesia in an ambulatory  setting   K. Scherry Ran , MD [phone]

## 2022-11-26 NOTE — Progress Notes (Signed)
Pt's states no medical or surgical changes since previsit or office visit. 

## 2022-11-26 NOTE — Op Note (Signed)
[facility] Patient Name: Jamie Cardenas Procedure Date: 11/26/2022 9:48 AM MRN: [id number] Endoscopist: Napoleon Form , MD, [id number] Age: 47 Referring MD:  Date of Birth: 1975-09-01 Gender: Female Account #: [id number] Procedure:                Colonoscopy Indications:              Screening for colorectal malignant neoplasm Procedure:                Pre-Anesthesia Assessment:                           - Prior to the procedure, a History and Physical                            was performed, and patient medications and                            allergies were reviewed. The patient's tolerance of                            previous anesthesia was also reviewed. The risks                            and benefits of the procedure and the sedation                            options and risks were discussed with the patient.                            All questions were answered, and informed consent                            was obtained. Prior Anticoagulants: The patient has                            taken no anticoagulant or antiplatelet agents. ASA                            Grade Assessment: II - A patient with mild systemic                            disease. After reviewing the risks and benefits,                            the patient was deemed in satisfactory condition to                            undergo the procedure.                           After obtaining informed consent, the colonoscope                            was passed under direct vision. Throughout the  procedure, the patient's blood pressure, pulse, and                            oxygen saturations were monitored continuously. The                            Olympus PCF-H190DL (#[id number]) Colonoscope was                            introduced through the anus and advanced to the the                            cecum, identified by appendiceal orifice and                             ileocecal valve. The colonoscopy was performed                            without difficulty. The patient tolerated the                            procedure well. The quality of the bowel                            preparation was poor. The ileocecal valve,                            appendiceal orifice, and rectum were photographed. Scope In: 10:23:31 AM Scope Out: 10:34:18 AM Scope Withdrawal Time: 0 hours 1 minute 52 seconds  Total Procedure Duration: 0 hours 10 minutes 47 seconds  Findings:                 The perianal and digital rectal examinations were                            normal.                           A 4 mm polyp was found in the transverse colon. The                            polyp was sessile. The polyp was removed with a                            cold snare. Resection and retrieval were complete.                           A moderate amount of stool was found in the entire                            colon, interfering with visualization. Lavage of                            the area was performed, resulting in incomplete  clearance with continued poor visualization. Complications:            No immediate complications. Estimated Blood Loss:     Estimated blood loss: none. Impression:               - Preparation of the colon was poor.                           - One 4 mm polyp in the transverse colon, removed                            with a cold snare. Resected and retrieved.                           - Stool in the entire examined colon. Recommendation:           - Patient has a contact number available for                            emergencies. The signs and symptoms of potential                            delayed complications were discussed with the                            patient. Return to normal activities tomorrow.                            Written discharge instructions were provided to the                             patient.                           - Resume previous diet.                           - Continue present medications.                           - For future colonoscopy the patient will require                            an extended preparation. If there are any                            questions, please contact the gastroenterologist.                           - Repeat colonoscopy at the next available                            appointment because the bowel preparation was                            suboptimal. Napoleon Form, MD 11/26/2022 10:42:46 AM This  report has been signed electronically.

## 2022-11-27 ENCOUNTER — Telehealth: Payer: Self-pay

## 2022-11-27 NOTE — Telephone Encounter (Signed)
  Follow up Call-     11/26/2022    9:45 AM  Call back number  Post procedure Call Back phone  # [phone]  Permission to leave phone message Yes     Left message

## 2022-11-28 LAB — SURGICAL PATHOLOGY

## 2022-11-29 ENCOUNTER — Encounter: Payer: Self-pay | Admitting: Gastroenterology

## 2022-12-10 ENCOUNTER — Encounter: Payer: Self-pay | Admitting: Gastroenterology

## 2022-12-23 ENCOUNTER — Encounter: Payer: BC Managed Care – PPO | Admitting: Gastroenterology

## 2022-12-25 ENCOUNTER — Encounter: Payer: BC Managed Care – PPO | Admitting: Internal Medicine
